# Patient Record
Sex: Female | Born: 1990
Health system: Southern US, Community
[De-identification: ages and names within clinical notes are randomized; demographics above are authoritative.]

## PROBLEM LIST (undated history)

## (undated) ENCOUNTER — Inpatient Hospital Stay (HOSPITAL_COMMUNITY): Payer: Self-pay

## (undated) ENCOUNTER — Emergency Department (HOSPITAL_BASED_OUTPATIENT_CLINIC_OR_DEPARTMENT_OTHER): Admission: EM | Payer: BLUE CROSS/BLUE SHIELD | Source: Home / Self Care

## (undated) DIAGNOSIS — F329 Major depressive disorder, single episode, unspecified: Secondary | ICD-10-CM

## (undated) DIAGNOSIS — T7491XA Unspecified adult maltreatment, confirmed, initial encounter: Secondary | ICD-10-CM

## (undated) DIAGNOSIS — F32A Depression, unspecified: Secondary | ICD-10-CM

## (undated) DIAGNOSIS — Z789 Other specified health status: Secondary | ICD-10-CM

## (undated) DIAGNOSIS — F509 Eating disorder, unspecified: Secondary | ICD-10-CM

## (undated) DIAGNOSIS — T7840XA Allergy, unspecified, initial encounter: Secondary | ICD-10-CM

## (undated) HISTORY — DX: Allergy, unspecified, initial encounter: T78.40XA

## (undated) HISTORY — DX: Depression, unspecified: F32.A

## (undated) HISTORY — DX: Eating disorder, unspecified: F50.9

## (undated) HISTORY — PX: INDUCED ABORTION: SHX677

---
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
## 2012-06-04 ENCOUNTER — Emergency Department (HOSPITAL_COMMUNITY): Payer: Self-pay

## 2012-06-04 ENCOUNTER — Emergency Department (HOSPITAL_COMMUNITY)
Admission: EM | Admit: 2012-06-04 | Discharge: 2012-06-04 | Disposition: A | Discharge status: Home / Self Care | Payer: Self-pay | Attending: Emergency Medicine | Admitting: Emergency Medicine

## 2012-06-04 ENCOUNTER — Encounter (HOSPITAL_COMMUNITY): Payer: Self-pay | Admitting: Emergency Medicine

## 2012-06-04 DIAGNOSIS — S62143A Displaced fracture of body of hamate [unciform] bone, unspecified wrist, initial encounter for closed fracture: Secondary | ICD-10-CM | POA: Insufficient documentation

## 2012-06-04 DIAGNOSIS — IMO0002 Reserved for concepts with insufficient information to code with codable children: Secondary | ICD-10-CM | POA: Insufficient documentation

## 2012-06-04 DIAGNOSIS — S60511A Abrasion of right hand, initial encounter: Secondary | ICD-10-CM

## 2012-06-04 DIAGNOSIS — F17200 Nicotine dependence, unspecified, uncomplicated: Secondary | ICD-10-CM | POA: Insufficient documentation

## 2012-06-04 DIAGNOSIS — Z23 Encounter for immunization: Secondary | ICD-10-CM | POA: Insufficient documentation

## 2012-06-04 DIAGNOSIS — S62141A Displaced fracture of body of hamate [unciform] bone, right wrist, initial encounter for closed fracture: Secondary | ICD-10-CM

## 2012-06-04 MED ORDER — IBUPROFEN 600 MG PO TABS: 600.0000 mg | ORAL_TABLET | Freq: Four times a day (QID) | ORAL | Status: DC | PRN

## 2012-06-04 MED ORDER — HYDROCODONE-ACETAMINOPHEN 5-325 MG PO TABS: 1.0000 | ORAL_TABLET | Freq: Four times a day (QID) | ORAL | Status: DC | PRN

## 2012-06-04 MED ORDER — OXYCODONE-ACETAMINOPHEN 5-325 MG PO TABS
1.0000 | ORAL_TABLET | Freq: Once | ORAL | Status: AC
  Administered 2012-06-04: 1 via ORAL
  Filled 2012-06-04: qty 1

## 2012-06-04 MED ORDER — TETANUS-DIPHTH-ACELL PERTUSSIS 5-2.5-18.5 LF-MCG/0.5 IM SUSP
0.5000 mL | Freq: Once | INTRAMUSCULAR | Status: AC
  Administered 2012-06-04: 0.5 mL via INTRAMUSCULAR
  Filled 2012-06-04: qty 0.5

## 2012-06-04 MED ORDER — AMOXICILLIN-POT CLAVULANATE 875-125 MG PO TABS: 1.0000 | ORAL_TABLET | Freq: Two times a day (BID) | ORAL | Status: DC
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
## 2012-06-06 NOTE — ED Provider Notes (Signed)
Medical screening examination/treatment/procedure(s) were performed by non-physician practitioner and as supervising physician I was immediately available for consultation/collaboration.   Aundrea Higginbotham M Leeman Johnsey, DO 06/06/12 1828 

## 2012-06-19 ENCOUNTER — Emergency Department (HOSPITAL_COMMUNITY): Payer: Self-pay

## 2012-06-19 ENCOUNTER — Encounter (HOSPITAL_COMMUNITY): Payer: Self-pay | Admitting: *Deleted

## 2012-06-19 ENCOUNTER — Emergency Department (HOSPITAL_COMMUNITY)
Admission: EM | Admit: 2012-06-19 | Discharge: 2012-06-19 | Disposition: A | Discharge status: Home / Self Care | Payer: Self-pay | Attending: Emergency Medicine | Admitting: Emergency Medicine

## 2012-06-19 DIAGNOSIS — S62141D Displaced fracture of body of hamate [unciform] bone, right wrist, subsequent encounter for fracture with routine healing: Secondary | ICD-10-CM

## 2012-06-19 DIAGNOSIS — Y939 Activity, unspecified: Secondary | ICD-10-CM | POA: Insufficient documentation

## 2012-06-19 DIAGNOSIS — S62143A Displaced fracture of body of hamate [unciform] bone, unspecified wrist, initial encounter for closed fracture: Secondary | ICD-10-CM | POA: Insufficient documentation

## 2012-06-19 DIAGNOSIS — Y929 Unspecified place or not applicable: Secondary | ICD-10-CM | POA: Insufficient documentation

## 2012-06-19 DIAGNOSIS — F17200 Nicotine dependence, unspecified, uncomplicated: Secondary | ICD-10-CM | POA: Insufficient documentation

## 2012-06-19 DIAGNOSIS — X58XXXA Exposure to other specified factors, initial encounter: Secondary | ICD-10-CM | POA: Insufficient documentation

## 2012-06-19 MED ORDER — TRAMADOL HCL 50 MG PO TABS: 50.0000 mg | ORAL_TABLET | Freq: Three times a day (TID) | ORAL | Status: DC | PRN

## 2012-06-19 MED ORDER — HYDROCODONE-ACETAMINOPHEN 5-325 MG PO TABS
2.0000 | ORAL_TABLET | Freq: Once | ORAL | Status: AC
  Administered 2012-06-19: 2 via ORAL
  Filled 2012-06-19: qty 2
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
## 2012-06-19 NOTE — ED Provider Notes (Signed)
I saw and evaluated the patient, reviewed the resident's note and I agree with the findings and plan.   .Face to face Exam:  General:  Awake HEENT:  Atraumatic Resp:  Normal effort Abd:  Nondistended Neuro:No focal weakness   Nelia Shi, MD 06/19/12 1820
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
## 2013-01-31 ENCOUNTER — Encounter (HOSPITAL_COMMUNITY): Payer: Self-pay | Admitting: Emergency Medicine

## 2013-01-31 ENCOUNTER — Emergency Department (HOSPITAL_COMMUNITY): Payer: Self-pay

## 2013-01-31 ENCOUNTER — Emergency Department (HOSPITAL_COMMUNITY)
Admission: EM | Admit: 2013-01-31 | Discharge: 2013-01-31 | Disposition: A | Discharge status: Home / Self Care | Payer: Self-pay | Attending: Emergency Medicine | Admitting: Emergency Medicine

## 2013-01-31 DIAGNOSIS — F17200 Nicotine dependence, unspecified, uncomplicated: Secondary | ICD-10-CM | POA: Insufficient documentation

## 2013-01-31 DIAGNOSIS — J40 Bronchitis, not specified as acute or chronic: Secondary | ICD-10-CM | POA: Insufficient documentation

## 2013-01-31 MED ORDER — PREDNISONE 20 MG PO TABS: 40.0000 mg | ORAL_TABLET | Freq: Every day | ORAL | Status: DC

## 2013-01-31 MED ORDER — ALBUTEROL SULFATE HFA 108 (90 BASE) MCG/ACT IN AERS
2.0000 | INHALATION_SPRAY | Freq: Once | RESPIRATORY_TRACT | Status: AC
  Administered 2013-01-31: 2 via RESPIRATORY_TRACT
  Filled 2013-01-31: qty 6.7

## 2013-01-31 MED ORDER — PSEUDOEPHEDRINE HCL 30 MG PO TABS: 30.0000 mg | ORAL_TABLET | ORAL | Status: DC | PRN

## 2013-01-31 MED ORDER — IBUPROFEN 800 MG PO TABS: 800.0000 mg | ORAL_TABLET | Freq: Three times a day (TID) | ORAL | Status: DC

## 2013-01-31 NOTE — ED Notes (Signed)
Pt reports1 week hx of sore throat, cough and chest wall pain, headaches. Tx with motrin, did not decrease pain

## 2013-01-31 NOTE — ED Provider Notes (Signed)
CSN: [id number]     Arrival date & time 01/31/13  1526 History   None    This chart was scribed for non-physician practitioner, Jaynie Crumble, PA-C, working with Roney Marion, MD by Arlan Organ, ED Scribe. This patient was seen in room WTR9/WTR9 and the patient's care was started at 4:31 PM.   No chief complaint on file.  The history is provided by the patient. No language interpreter was used.   HPI Comments: Linda Wallace is a 22 y.o. female who presents to the Emergency Department complaining of a sudden onset, gradually worsening, constant sore throat that started about 7 days ago. She also reports a HA, SOB, and a productive cough consisting of green sputum. She has not tried any OTC medications for her symptoms. 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  No past medical history on file. No past surgical history on file. Family History  Problem Relation Age of Onset  . Hypertension Other    History  Substance Use Topics  . Smoking status: Current Every Day Smoker    Types: Cigarettes  . Smokeless tobacco: Not on file  . Alcohol Use: Yes   OB History   Grav Para Term Preterm Abortions TAB SAB Ect Mult Living                 Review of Systems  HENT: Positive for sore throat.   Neurological: Positive for headaches.  All other systems reviewed and are negative.    Allergies  Review of patient's allergies indicates no known allergies.  Home Medications   Current Outpatient Rx  Name  Route  Sig  Dispense  Refill  . ibuprofen (ADVIL,MOTRIN) 600 MG tablet   Oral   Take 1 tablet (600 mg total) by mouth every 6 (six) hours as needed for pain.   30 tablet   0   . traMADol (ULTRAM) 50 MG tablet   Oral   Take 1 tablet (50 mg total) by mouth every 8 (eight) hours as needed for pain.   30 tablet   0    There were no vitals taken for this visit. Physical Exam  Nursing note and vitals  reviewed. Constitutional: She is oriented to person, place, and time. She appears well-developed and well-nourished.  HENT:  Head: Normocephalic and atraumatic.  Right Ear: Tympanic membrane normal.  Left Ear: Tympanic membrane normal.  Mouth/Throat: Uvula is midline and mucous membranes are normal. No trismus in the jaw. Posterior oropharyngeal erythema present. No oropharyngeal exudate or tonsillar abscesses.  Nasal congestion   Eyes: EOM are normal.  Neck: Normal range of motion.  Cardiovascular: Normal rate.   Pulmonary/Chest: Effort normal.  Musculoskeletal: Normal range of motion.  Neurological: She is alert and oriented to person, place, and time.  Skin: Skin is warm and dry.  Psychiatric: She has a normal mood and affect. Her behavior is normal.    ED Course  Procedures (including critical care time)   COORDINATION OF CARE: 4:36 PM- Will order chest X-Ray. Discussed treatment plan with pt at bedside and pt agreed to plan.     Labs Review Labs Reviewed - No data to display Imaging Review Dg Chest 2 View  01/31/2013   CLINICAL DATA:  Cough and shortness of breath for 10 days  EXAM: CHEST  2 VIEW  COMPARISON:  None.  FINDINGS: The heart size and mediastinal contours are within normal limits. Both lungs  are clear. The visualized skeletal structures are unremarkable.  IMPRESSION: No active cardiopulmonary disease.   Electronically Signed   By: Esperanza Heir M.D.   On: 01/31/2013 17:08    EKG Interpretation   None       MDM   1. Bronchitis     Patient with viral URI type symptoms. Admits to possible fevers at home although not did not measured her temperature, shortness of breath, cough, congestion. She did not take any over-the-counter medications prior to coming in. Her vital signs are normal. Her examination is unremarkable. Her lungs are clear. Chest x-ray obtained to rule out pneumonia and is normal. Will treat for viral bronchitis. Will start on a short course of  prednisone for 5 days, ibuprofen for her pain, Sudafed for congestion. Inhaler provided. She is encouraged to stop smoking.  Filed Vitals:   01/31/13 1638  BP: 108/56  Pulse: 84  Temp: 98.2 F (36.8 C)  TempSrc: Oral  Resp: 18  Weight: 116 lb (52.617 kg)  SpO2: 99%    I personally performed the services described in this documentation, which was scribed in my presence. The recorded information has been reviewed and is accurate.    Lottie Mussel, PA-C 01/31/13 1719

## 2013-02-04 NOTE — ED Provider Notes (Signed)
I personally performed the services described in this documentation, which was scribed in my presence. The recorded information has been reviewed and is accurate.   Ahmir Bracken J Callie Bunyard, MD 02/04/13 0719 

## 2013-03-15 ENCOUNTER — Encounter (HOSPITAL_COMMUNITY): Payer: Self-pay | Admitting: Emergency Medicine

## 2013-03-15 ENCOUNTER — Emergency Department (HOSPITAL_COMMUNITY)
Admission: EM | Admit: 2013-03-15 | Discharge: 2013-03-15 | Disposition: A | Discharge status: Home / Self Care | Payer: BC Managed Care – PPO | Attending: Emergency Medicine | Admitting: Emergency Medicine

## 2013-03-15 DIAGNOSIS — R1013 Epigastric pain: Secondary | ICD-10-CM | POA: Insufficient documentation

## 2013-03-15 DIAGNOSIS — Z3202 Encounter for pregnancy test, result negative: Secondary | ICD-10-CM | POA: Insufficient documentation

## 2013-03-15 DIAGNOSIS — R51 Headache: Secondary | ICD-10-CM | POA: Insufficient documentation

## 2013-03-15 DIAGNOSIS — IMO0002 Reserved for concepts with insufficient information to code with codable children: Secondary | ICD-10-CM | POA: Insufficient documentation

## 2013-03-15 DIAGNOSIS — F17200 Nicotine dependence, unspecified, uncomplicated: Secondary | ICD-10-CM | POA: Insufficient documentation

## 2013-03-15 DIAGNOSIS — R197 Diarrhea, unspecified: Secondary | ICD-10-CM | POA: Insufficient documentation

## 2013-03-15 DIAGNOSIS — R42 Dizziness and giddiness: Secondary | ICD-10-CM | POA: Insufficient documentation

## 2013-03-15 DIAGNOSIS — R112 Nausea with vomiting, unspecified: Secondary | ICD-10-CM | POA: Insufficient documentation

## 2013-03-15 LAB — URINALYSIS W MICROSCOPIC + REFLEX CULTURE
BILIRUBIN URINE: NEGATIVE
GLUCOSE, UA: NEGATIVE mg/dL
Hgb urine dipstick: NEGATIVE
Ketones, ur: NEGATIVE mg/dL
LEUKOCYTES UA: NEGATIVE
NITRITE: NEGATIVE
PH: 8 (ref 5.0–8.0)
Protein, ur: 100 mg/dL — AB
SPECIFIC GRAVITY, URINE: 1.029 (ref 1.005–1.030)
Urobilinogen, UA: 0.2 mg/dL (ref 0.0–1.0)

## 2013-03-15 LAB — COMPREHENSIVE METABOLIC PANEL
ALBUMIN: 4.7 g/dL (ref 3.5–5.2)
ALT: 28 U/L (ref 0–35)
AST: 24 U/L (ref 0–37)
Alkaline Phosphatase: 65 U/L (ref 39–117)
BUN: 7 mg/dL (ref 6–23)
CALCIUM: 9.8 mg/dL (ref 8.4–10.5)
CO2: 28 mEq/L (ref 19–32)
Chloride: 105 mEq/L (ref 96–112)
Creatinine, Ser: 0.87 mg/dL (ref 0.50–1.10)
GFR calc Af Amer: >90 mL/min (ref >90)
GFR calc non Af Amer: >90 mL/min (ref >90)
Glucose, Bld: 95 mg/dL (ref 70–99)
Potassium: 4.5 mEq/L (ref 3.7–5.3)
SODIUM: 148 meq/L — AB (ref 137–147)
TOTAL PROTEIN: 8.4 g/dL — AB (ref 6.0–8.3)
Total Bilirubin: 0.2 mg/dL — ABNORMAL LOW (ref 0.3–1.2)

## 2013-03-15 LAB — CBC WITH DIFFERENTIAL/PLATELET
BASOS ABS: 0 10*3/uL (ref 0.0–0.1)
Basophils Relative: 0 % (ref 0–1)
EOS ABS: 0 10*3/uL (ref 0.0–0.7)
EOS PCT: 0 % (ref 0–5)
HCT: 44.8 % (ref 36.0–46.0)
Hemoglobin: 15.3 g/dL — ABNORMAL HIGH (ref 12.0–15.0)
Lymphocytes Relative: 11 % — ABNORMAL LOW (ref 12–46)
Lymphs Abs: 1.7 10*3/uL (ref 0.7–4.0)
MCH: 30.9 pg (ref 26.0–34.0)
MCHC: 34.2 g/dL (ref 30.0–36.0)
MCV: 90.5 fL (ref 78.0–100.0)
Monocytes Absolute: 0.6 10*3/uL (ref 0.1–1.0)
Monocytes Relative: 4 % (ref 3–12)
NEUTROS PCT: 85 % — AB (ref 43–77)
Neutro Abs: 13.8 10*3/uL — ABNORMAL HIGH (ref 1.7–7.7)
PLATELETS: 367 10*3/uL (ref 150–400)
RBC: 4.95 MIL/uL (ref 3.87–5.11)
RDW: 12.7 % (ref 11.5–15.5)
WBC: 16.2 10*3/uL — AB (ref 4.0–10.5)

## 2013-03-15 LAB — POCT PREGNANCY, URINE: Preg Test, Ur: NEGATIVE

## 2013-03-15 LAB — LIPASE, BLOOD: Lipase: 22 U/L (ref 11–59)

## 2013-03-15 MED ORDER — ONDANSETRON 8 MG PO TBDP
8.0000 mg | ORAL_TABLET | Freq: Once | ORAL | Status: AC
  Administered 2013-03-15: 8 mg via ORAL
  Filled 2013-03-15: qty 1

## 2013-03-15 MED ORDER — ONDANSETRON HCL 4 MG PO TABS: 4.0000 mg | ORAL_TABLET | Freq: Four times a day (QID) | ORAL | Status: DC
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
## 2013-03-15 NOTE — Discharge Instructions (Signed)
Drink plenty of fluids. Take zofran as needed for nausea. You can take 1-2 tablets every 6 hrs. Follow up with your doctor. Avoid alcohol.    How Much is Too Much Alcohol? Drinking too much alcohol can cause injury, accidents, and health problems. These types of problems can include:   Car crashes.  Falls.  Family fighting (domestic violence).  Drowning.  Fights.  Injuries.  Burns.  Damage to certain organs.  Having a baby with birth defects. ONE DRINK CAN BE TOO MUCH WHEN YOU ARE:  Working.  Pregnant or breastfeeding.  Taking medicines. Ask your doctor.  Driving or planning to drive. WHAT IS A STANDARD DRINK?   1 regular beer (12 ounces or 360 milliliters).  1 glass of wine (5 ounces or 150 milliliters).  1 shot of liquor (1.5 ounces or 45 milliliters). BLOOD ALCOHOL LEVELS   .00 A person is sober.  Marland Kitchen.03 A person has no trouble keeping balance, talking, or seeing right, but a "buzz" may be felt.  Marland Kitchen.05 A person feels "buzzed" and relaxed.  Marland Kitchen.08 or .10  A person is drunk. He or she has trouble talking, seeing right, and keeping his or her balance.  .15 A person loses body control and may pass out (blackout).  .20 A person has trouble walking (staggering) and throws up (vomits).  .30 A person will pass out (unconscious).  .40+ A person will be in a coma. Death is possible. If you or someone you know has a drinking problem, get help from a doctor.  Document Released: 12/23/2008 Document Revised: 05/21/2011 Document Reviewed: 12/23/2008 [facility] LLCExitCare Patient Information 2014 [place], [place].
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
## 2013-03-25 NOTE — ED Provider Notes (Signed)
Medical screening examination/treatment/procedure(s) were performed by non-physician practitioner and as supervising physician I was immediately available for consultation/collaboration.  EKG Interpretation   None         Valita Righter, MD 03/25/13 0021 

## 2013-07-01 ENCOUNTER — Emergency Department (HOSPITAL_BASED_OUTPATIENT_CLINIC_OR_DEPARTMENT_OTHER)
Admission: EM | Admit: 2013-07-01 | Discharge: 2013-07-01 | Disposition: A | Discharge status: Home / Self Care | Payer: BC Managed Care – PPO | Attending: Emergency Medicine | Admitting: Emergency Medicine

## 2013-07-01 ENCOUNTER — Encounter (HOSPITAL_BASED_OUTPATIENT_CLINIC_OR_DEPARTMENT_OTHER): Payer: Self-pay | Admitting: Emergency Medicine

## 2013-07-01 DIAGNOSIS — F17200 Nicotine dependence, unspecified, uncomplicated: Secondary | ICD-10-CM | POA: Insufficient documentation

## 2013-07-01 DIAGNOSIS — B360 Pityriasis versicolor: Secondary | ICD-10-CM

## 2013-07-01 MED ORDER — SELENIUM SULFIDE 2.5 % EX LOTN: TOPICAL_LOTION | CUTANEOUS | Status: DC

## 2013-07-01 NOTE — Discharge Instructions (Signed)
Tinea Versicolor  Tinea versicolor is a common yeast infection of the skin. This condition becomes known when the yeast on our skin starts to overgrow (yeast is a normal inhabitant on our skin). This condition is noticed as white or light brown patches on brown skin, and is more evident in the summer on tanned skin. These areas are slightly scaly if scratched. The light patches from the yeast become evident when the yeast creates "holes in your suntan". This is most often noticed in the summer. The patches are usually located on the chest, back, pubis, neck and body folds. However, it may occur on any area of body. Mild itching and inflammation (redness or soreness) may be present.  DIAGNOSIS   The diagnosisof this is made clinically (by looking). Cultures from samples are usually not needed. Examination under the microscope may help. However, yeast is normally found on skin. The diagnosis still remains clinical. Examination under Wood's Ultraviolet Light can determine the extent of the infection.  TREATMENT   This common infection is usually only of cosmetic (only a concern to your appearance). It is easily treated with dandruff shampoo used during showers or bathing. Vigorous scrubbing will eliminate the yeast over several days time. The light areas in your skin may remain for weeks or months after the infection is cured unless your skin is exposed to sunlight. The lighter or darker spots caused by the fungus that remain after complete treatment are not a sign of treatment failure; it will take a long time to resolve. Your caregiver may recommend a number of commercial preparations or medication by mouth if home care is not working. Recurrence is common and preventative medication may be necessary.  This skin condition is not highly contagious. Special care is not needed to protect close friends and family members. Normal hygiene is usually enough. Follow up is required only if you develop complications (such as a  secondary infection from scratching), if recommended by your caregiver, or if no relief is obtained from the preparations used.  Document Released: 02/24/2000 Document Revised: 05/21/2011 Document Reviewed: 04/07/2008  Exit[employer] Patient Information 2014 [employer].

## 2013-07-01 NOTE — ED Provider Notes (Signed)
CSN: [id number]     Arrival date & time 07/01/13  1316 History   First MD Initiated Contact with Patient 07/01/13 1324     Chief Complaint  Patient presents with  . Rash     (Consider location/radiation/quality/duration/timing/severity/associated sxs/prior Treatment) HPI Comments: Patient presents to the ER for evaluation of rash. She first noticed a rash on her arms 2 weeks ago. Since then the areas continue to spread. She is noticing spots now on her arms and legs. She reports that they occasionally itch, but do not hurt. No drainage. No new skin products. No contacts with similar rash.  Patient is a 23 y.o. female presenting with rash.  Rash   History reviewed. No pertinent past medical history. History reviewed. No pertinent past surgical history. Family History  Problem Relation Age of Onset  . Hypertension Other   . Diabetes Other    History  Substance Use Topics  . Smoking status: Current Every Day Smoker -- 0.50 packs/day    Types: Cigarettes  . Smokeless tobacco: Not on file  . Alcohol Use: Yes   OB History   Grav Para Term Preterm Abortions TAB SAB Ect Mult Living                 Review of Systems  Skin: Positive for rash.  All other systems reviewed and are negative.     Allergies  Review of patient's allergies indicates no known allergies.  Home Medications   Prior to Admission medications   Not on File   BP 113/56  Pulse 84  Temp(Src) 98.4 F (36.9 C) (Oral)  Resp 16  Ht 5\' 3"  (1.6 m)  Wt 117 lb (53.071 kg)  BMI 20.73 kg/m2  SpO2 99%  LMP 06/08/2013 Physical Exam  Constitutional: She is oriented to person, place, and time. She appears well-developed and well-nourished. No distress.  HENT:  Head: Normocephalic and atraumatic.  Right Ear: Hearing normal.  Left Ear: Hearing normal.  Nose: Nose normal.  Mouth/Throat: Oropharynx is clear and moist and mucous membranes are normal.  Eyes: Conjunctivae and EOM are normal. Pupils are equal,  round, and reactive to light.  Neck: Normal range of motion. Neck supple.  Cardiovascular: Regular rhythm, S1 normal and S2 normal.  Exam reveals no gallop and no friction rub.   No murmur heard. Pulmonary/Chest: Effort normal and breath sounds normal. No respiratory distress. She exhibits no tenderness.  Abdominal: Soft. Normal appearance and bowel sounds are normal. There is no hepatosplenomegaly. There is no tenderness. There is no rebound, no guarding, no tenderness at McBurney's point and negative Murphy's sign. No hernia.  Musculoskeletal: Normal range of motion.  Neurological: She is alert and oriented to person, place, and time. She has normal strength. No cranial nerve deficit or sensory deficit. Coordination normal. GCS eye subscore is 4. GCS verbal subscore is 5. GCS motor subscore is 6.  Skin: Skin is warm, dry and intact. Rash (circular, slightly raised patches and plaques varying from several millimeters to 1 cm in diameter - some are hyperpigmented, some are hypopigmented - nontender, nonerythematous) noted. No cyanosis.  Psychiatric: She has a normal mood and affect. Her speech is normal and behavior is normal. Thought content normal.    ED Course  Procedures (including critical care time) Labs Review Labs Reviewed - No data to display  Imaging Review No results found.   EKG Interpretation None      MDM   Final diagnoses:  Tinea versicolor   Patient presents  with a diffuse rash there are circular patches on extremities. Lesions are apparently of various age and some are hyperpigmented, some hypopigmented. This is consistent with tinea versicolor, treated with selenium. No evidence of bacterial infection.   Gilda Creasehristopher J. Pollina, MD 07/02/13 [phone]

## 2013-07-01 NOTE — ED Notes (Signed)
Pt c/o rash to entire body x 2 weeks

## 2013-08-26 ENCOUNTER — Emergency Department (HOSPITAL_COMMUNITY): Payer: BC Managed Care – PPO

## 2013-08-26 ENCOUNTER — Emergency Department (HOSPITAL_COMMUNITY)
Admission: EM | Admit: 2013-08-26 | Discharge: 2013-08-26 | Disposition: A | Discharge status: Home / Self Care | Payer: BC Managed Care – PPO | Attending: Emergency Medicine | Admitting: Emergency Medicine

## 2013-08-26 ENCOUNTER — Encounter (HOSPITAL_COMMUNITY): Payer: Self-pay | Admitting: Emergency Medicine

## 2013-08-26 DIAGNOSIS — Y929 Unspecified place or not applicable: Secondary | ICD-10-CM | POA: Insufficient documentation

## 2013-08-26 DIAGNOSIS — R209 Unspecified disturbances of skin sensation: Secondary | ICD-10-CM | POA: Insufficient documentation

## 2013-08-26 DIAGNOSIS — S61209A Unspecified open wound of unspecified finger without damage to nail, initial encounter: Secondary | ICD-10-CM | POA: Insufficient documentation

## 2013-08-26 DIAGNOSIS — W230XXA Caught, crushed, jammed, or pinched between moving objects, initial encounter: Secondary | ICD-10-CM | POA: Insufficient documentation

## 2013-08-26 DIAGNOSIS — S61210A Laceration without foreign body of right index finger without damage to nail, initial encounter: Secondary | ICD-10-CM

## 2013-08-26 DIAGNOSIS — Y9389 Activity, other specified: Secondary | ICD-10-CM | POA: Insufficient documentation

## 2013-08-26 DIAGNOSIS — Z87891 Personal history of nicotine dependence: Secondary | ICD-10-CM | POA: Insufficient documentation

## 2013-08-26 DIAGNOSIS — F1010 Alcohol abuse, uncomplicated: Secondary | ICD-10-CM | POA: Insufficient documentation

## 2013-08-26 NOTE — Discharge Instructions (Signed)

## 2013-08-26 NOTE — ED Notes (Signed)
Patient states she smashed her hand in a sliding glass door this am. Denies any home medications PTA.

## 2013-08-26 NOTE — ED Notes (Signed)
Patient states she is unable to move her thumb, index, or middle fingers. Blood noted to her hand.

## 2013-08-26 NOTE — ED Provider Notes (Signed)
CSN: [id number]     Arrival date & time 08/26/13  [id number] History   First MD Initiated Contact with Patient 08/26/13 [phone]     Chief Complaint  Patient presents with  . Hand Pain    right     (Consider location/radiation/quality/duration/timing/severity/associated sxs/prior Treatment) Patient is a 23 y.o. female presenting with hand pain. The history is provided by the patient.  Hand Pain This is a new problem. The current episode started 3 to 5 hours ago. The problem has not changed since onset.Pertinent negatives include no chest pain, no abdominal pain and no shortness of breath. Nothing aggravates the symptoms. Nothing relieves the symptoms.    History reviewed. No pertinent past medical history. History reviewed. No pertinent past surgical history. Family History  Problem Relation Age of Onset  . Hypertension Other   . Diabetes Other    History  Substance Use Topics  . Smoking status: Former Smoker    Quit date: 08/18/2013  . Smokeless tobacco: Not on file  . Alcohol Use: Yes     Comment: occ   OB History   Grav Para Term Preterm Abortions TAB SAB Ect Mult Living                 Review of Systems  Constitutional: Negative for fever and chills.  Respiratory: Negative for cough and shortness of breath.   Cardiovascular: Negative for chest pain.  Gastrointestinal: Negative for vomiting and abdominal pain.  All other systems reviewed and are negative.     Allergies  Food  Home Medications   Prior to Admission medications   Medication Sig Start Date End Date Taking? Authorizing Provider  albuterol (PROVENTIL HFA;VENTOLIN HFA) 108 (90 BASE) MCG/ACT inhaler Inhale into the lungs every 6 (six) hours as needed for wheezing or shortness of breath.   Yes Historical Provider, MD  Multiple Vitamins-Minerals (HAIR/SKIN/NAILS PO) Take 1 tablet by mouth daily.   Yes Historical Provider, MD   BP 111/63  Pulse 76  Temp(Src) 98 F (36.7 C) (Oral)  Resp 16  SpO2 97%  LMP  08/12/2013 Physical Exam  Nursing note and vitals reviewed. Constitutional: She is oriented to person, place, and time. She appears well-developed and well-nourished. No distress.  HENT:  Head: Normocephalic and atraumatic.  Mouth/Throat: Oropharynx is clear and moist.  Eyes: EOM are normal. Pupils are equal, round, and reactive to light.  Neck: Normal range of motion. Neck supple.  Cardiovascular: Normal rate and regular rhythm.  Exam reveals no friction rub.   No murmur heard. Pulmonary/Chest: Effort normal and breath sounds normal. No respiratory distress. She has no wheezes. She has no rales.  Abdominal: Soft. She exhibits no distension. There is no tenderness. There is no rebound.  Musculoskeletal: Normal range of motion. She exhibits no edema.       Hands: Neurological: She is alert and oriented to person, place, and time. No cranial nerve deficit. She exhibits normal muscle tone. Coordination normal.  Skin: No rash noted. She is not diaphoretic.    ED Course  LACERATION REPAIR Date/Time: 08/26/2013 8:34 AM Performed by: Dagmar HaitWALDEN, WILLIAM BLAIR Authorized by: Dagmar HaitWALDEN, WILLIAM BLAIR Consent: Verbal consent obtained. Body area: upper extremity Location details: right index finger Laceration length: 1.5 cm Foreign bodies: no foreign bodies Tendon involvement: none Nerve involvement: none Vascular damage: no Patient sedated: no Preparation: Patient was prepped and draped in the usual sterile fashion. Irrigation solution: saline Irrigation method: syringe Amount of cleaning: standard Debridement: none Degree of undermining: none Skin  closure: 6-0 nylon Number of sutures: 2 Technique: simple Approximation: close Approximation difficulty: simple Patient tolerance: Patient tolerated the procedure well with no immediate complications.   (including critical care time) Labs Review Labs Reviewed - No data to display  Imaging Review Dg Hand Complete Right  08/26/2013    CLINICAL DATA:  Put right hand through sliding door, with laceration at the right third and fourth fingers, and associated pain.  EXAM: RIGHT HAND - COMPLETE 3+ VIEW  COMPARISON:  Right hand radiographs performed 06/19/2012  FINDINGS: There is no evidence of fracture or dislocation. The joint spaces are preserved; the known soft tissue lacerations are not well characterized on radiograph. No radiopaque foreign bodies are seen. The carpal rows are intact, and demonstrate normal alignment. Mild negative ulnar variance is noted.  IMPRESSION: No evidence of fracture or dislocation. No radiopaque foreign bodies seen.   Electronically Signed   By: Roanna RaiderJeffery  Chang M.D.   On: 08/26/2013 06:29     EKG Interpretation None      MDM   Final diagnoses:  Laceration of right index finger w/o foreign body w/o damage to nail    23 year old female here with hand injury. Signed hand in a glass door. No glass broken, but her hand got smashed in a door. She is heavily intoxicated. She has a small, 1.5 cm laceration to the pad of the right index finger just distal to the DIP joint. She has good motion of the finger. She states she cannot feel her entire hand, particularly touching can feel pain when I was clear a laceration. She has full range of motion. Neck her numbness is secondary to the injury and her intoxication. Doubt nerve injury. Will repair laceration, re-evaluate. On re-eval prior to lac repair, feeling returned to finger. ROM normal. Cap refill normal. Lac repair as above, stable for discharge.  Dagmar HaitWilliam Blair Walden, MD 08/26/13 [phone]

## 2014-01-26 ENCOUNTER — Emergency Department (HOSPITAL_COMMUNITY)
Admission: EM | Admit: 2014-01-26 | Discharge: 2014-01-26 | Disposition: A | Discharge status: Home / Self Care | Payer: BC Managed Care – PPO | Attending: Emergency Medicine | Admitting: Emergency Medicine

## 2014-01-26 ENCOUNTER — Encounter (HOSPITAL_COMMUNITY): Payer: Self-pay

## 2014-01-26 DIAGNOSIS — Z87891 Personal history of nicotine dependence: Secondary | ICD-10-CM | POA: Insufficient documentation

## 2014-01-26 DIAGNOSIS — Z79899 Other long term (current) drug therapy: Secondary | ICD-10-CM | POA: Diagnosis not present

## 2014-01-26 DIAGNOSIS — R1013 Epigastric pain: Secondary | ICD-10-CM | POA: Insufficient documentation

## 2014-01-26 DIAGNOSIS — Z3202 Encounter for pregnancy test, result negative: Secondary | ICD-10-CM | POA: Diagnosis not present

## 2014-01-26 LAB — COMPREHENSIVE METABOLIC PANEL
ALK PHOS: 53 U/L (ref 39–117)
ALT: 8 U/L (ref 0–35)
ANION GAP: 12 (ref 5–15)
AST: 16 U/L (ref 0–37)
Albumin: 4.6 g/dL (ref 3.5–5.2)
BILIRUBIN TOTAL: 0.4 mg/dL (ref 0.3–1.2)
BUN: 11 mg/dL (ref 6–23)
CHLORIDE: 103 meq/L (ref 96–112)
CO2: 28 mEq/L (ref 19–32)
Calcium: 9.9 mg/dL (ref 8.4–10.5)
Creatinine, Ser: 0.83 mg/dL (ref 0.50–1.10)
GFR calc Af Amer: >90 mL/min (ref >90)
GFR calc non Af Amer: >90 mL/min (ref >90)
Glucose, Bld: 95 mg/dL (ref 70–99)
POTASSIUM: 4.1 meq/L (ref 3.7–5.3)
SODIUM: 143 meq/L (ref 137–147)
TOTAL PROTEIN: 7.9 g/dL (ref 6.0–8.3)

## 2014-01-26 LAB — URINALYSIS, ROUTINE W REFLEX MICROSCOPIC
Glucose, UA: 250 mg/dL — AB
HGB URINE DIPSTICK: NEGATIVE
KETONES UR: NEGATIVE mg/dL
Leukocytes, UA: NEGATIVE
NITRITE: NEGATIVE
PH: 5.5 (ref 5.0–8.0)
Protein, ur: NEGATIVE mg/dL
SPECIFIC GRAVITY, URINE: 1.036 — AB (ref 1.005–1.030)
Urobilinogen, UA: 1 mg/dL (ref 0.0–1.0)

## 2014-01-26 LAB — URINE MICROSCOPIC-ADD ON

## 2014-01-26 LAB — CBC WITH DIFFERENTIAL/PLATELET
BASOS PCT: 0 % (ref 0–1)
Basophils Absolute: 0 10*3/uL (ref 0.0–0.1)
Eosinophils Absolute: 0.1 10*3/uL (ref 0.0–0.7)
Eosinophils Relative: 1 % (ref 0–5)
HCT: 43 % (ref 36.0–46.0)
Hemoglobin: 14.7 g/dL (ref 12.0–15.0)
Lymphocytes Relative: 33 % (ref 12–46)
Lymphs Abs: 2.9 10*3/uL (ref 0.7–4.0)
MCH: 30.9 pg (ref 26.0–34.0)
MCHC: 34.2 g/dL (ref 30.0–36.0)
MCV: 90.5 fL (ref 78.0–100.0)
MONOS PCT: 6 % (ref 3–12)
Monocytes Absolute: 0.6 10*3/uL (ref 0.1–1.0)
NEUTROS ABS: 5.3 10*3/uL (ref 1.7–7.7)
NEUTROS PCT: 60 % (ref 43–77)
PLATELETS: 240 10*3/uL (ref 150–400)
RBC: 4.75 MIL/uL (ref 3.87–5.11)
RDW: 12.4 % (ref 11.5–15.5)
WBC: 8.9 10*3/uL (ref 4.0–10.5)

## 2014-01-26 LAB — POC URINE PREG, ED: Preg Test, Ur: NEGATIVE

## 2014-01-26 MED ORDER — PANTOPRAZOLE SODIUM 20 MG PO TBEC: 20.0000 mg | DELAYED_RELEASE_TABLET | Freq: Every day | ORAL | Status: DC

## 2014-01-26 NOTE — ED Notes (Signed)
Pt states she has had abdominal pain with headache x 3 months.  Pt states it can be around her period but not all the time.  Pt is sexually active but uses condoms.  Pt states her menstrual was 3 weeks late.  Then pt had slight bleeding.  Pt is bleeding today and cycle is normal.  Pt does not have have a gynecologist.

## 2014-01-26 NOTE — Discharge Instructions (Signed)
Abdominal Pain, Women °Abdominal (stomach, pelvic, or belly) pain can be caused by many things. It is important to tell your doctor: °· The location of the pain. °· Does it come and go or is it present all the time? °· Are there things that start the pain (eating certain foods, exercise)? °· Are there other symptoms associated with the pain (fever, nausea, vomiting, diarrhea)? °All of this is helpful to know when trying to find the cause of the pain. °CAUSES  °· Stomach: virus or bacteria infection, or ulcer. °· Intestine: appendicitis (inflamed appendix), regional ileitis (Crohn's disease), ulcerative colitis (inflamed colon), irritable bowel syndrome, diverticulitis (inflamed diverticulum of the colon), or cancer of the stomach or intestine. °· Gallbladder disease or stones in the gallbladder. °· Kidney disease, kidney stones, or infection. °· Pancreas infection or cancer. °· Fibromyalgia (pain disorder). °· Diseases of the female organs: °¨ Uterus: fibroid (non-cancerous) tumors or infection. °¨ Fallopian tubes: infection or tubal pregnancy. °¨ Ovary: cysts or tumors. °¨ Pelvic adhesions (scar tissue). °¨ Endometriosis (uterus lining tissue growing in the pelvis and on the pelvic organs). °¨ Pelvic congestion syndrome (female organs filling up with blood just before the menstrual period). °¨ Pain with the menstrual period. °¨ Pain with ovulation (producing an egg). °¨ Pain with an IUD (intrauterine device, birth control) in the uterus. °¨ Cancer of the female organs. °· Functional pain (pain not caused by a disease, may improve without treatment). °· Psychological pain. °· Depression. °DIAGNOSIS  °Your doctor will decide the seriousness of your pain by doing an examination. °· Blood tests. °· X-rays. °· Ultrasound. °· CT scan (computed tomography, special type of X-ray). °· MRI (magnetic resonance imaging). °· Cultures, for infection. °· Barium enema (dye inserted in the large intestine, to better view it with  X-rays). °· Colonoscopy (looking in intestine with a lighted tube). °· Laparoscopy (minor surgery, looking in abdomen with a lighted tube). °· Major abdominal exploratory surgery (looking in abdomen with a large incision). °TREATMENT  °The treatment will depend on the cause of the pain.  °· Many cases can be observed and treated at home. °· Over-the-counter medicines recommended by your caregiver. °· Prescription medicine. °· Antibiotics, for infection. °· Birth control pills, for painful periods or for ovulation pain. °· Hormone treatment, for endometriosis. °· Nerve blocking injections. °· Physical therapy. °· Antidepressants. °· Counseling with a psychologist or psychiatrist. °· Minor or major surgery. °HOME CARE INSTRUCTIONS  °· Do not take laxatives, unless directed by your caregiver. °· Take over-the-counter pain medicine only if ordered by your caregiver. Do not take aspirin because it can cause an upset stomach or bleeding. °· Try a clear liquid diet (broth or water) as ordered by your caregiver. Slowly move to a bland diet, as tolerated, if the pain is related to the stomach or intestine. °· Have a thermometer and take your temperature several times a day, and record it. °· Bed rest and sleep, if it helps the pain. °· Avoid sexual intercourse, if it causes pain. °· Avoid stressful situations. °· Keep your follow-up appointments and tests, as your caregiver orders. °· If the pain does not go away with medicine or surgery, you may try: °¨ Acupuncture. °¨ Relaxation exercises (yoga, meditation). °¨ Group therapy. °¨ Counseling. °SEEK MEDICAL CARE IF:  °· You notice certain foods cause stomach pain. °· Your home care treatment is not helping your pain. °· You need stronger pain medicine. °· You want your IUD removed. °· You feel faint or   lightheaded. °· You develop nausea and vomiting. °· You develop a rash. °· You are having side effects or an allergy to your medicine. °SEEK IMMEDIATE MEDICAL CARE IF:  °· Your  pain does not go away or gets worse. °· You have a fever. °· Your pain is felt only in portions of the abdomen. The right side could possibly be appendicitis. The left lower portion of the abdomen could be colitis or diverticulitis. °· You are passing blood in your stools (bright red or black tarry stools, with or without vomiting). °· You have blood in your urine. °· You develop chills, with or without a fever. °· You pass out. °MAKE SURE YOU:  °· Understand these instructions. °· Will watch your condition. °· Will get help right away if you are not doing well or get worse. °Document Released: 12/24/2006 Document Revised: 07/13/2013 Document Reviewed: 01/13/2009 °[employer]® Patient Information ©2015 [employer]. This information is not intended to replace advice given to you by your health care provider. Make sure you discuss any questions you have with your health care provider. ° °

## 2014-01-26 NOTE — ED Notes (Signed)
Patient with c/o abdominal pain, which she states is usual for her during menses--patient currently menstruating No N/V/D, per patient Patient in NAD

## 2014-01-26 NOTE — ED Provider Notes (Signed)
CSN: [id number]     Arrival date & time 01/26/14  1716 History   First MD Initiated Contact with Patient 01/26/14 1931     Chief Complaint  Patient presents with  . Abdominal Pain     (Consider location/radiation/quality/duration/timing/severity/associated sxs/prior Treatment) Patient is a 23 y.o. female presenting with abdominal pain. The history is provided by the patient. No language interpreter was used.  Abdominal Pain Pain location:  Epigastric Pain quality: aching   Pain radiates to:  Does not radiate Pain severity:  Moderate Onset quality:  Sudden Duration:  2 weeks Timing:  Intermittent (every other day) Progression:  Waxing and waning Chronicity:  New Relieved by:  Nothing Worsened by:  Nothing tried Ineffective treatments:  None tried Associated symptoms: no chest pain, no chills, no cough, no diarrhea, no dysuria, no fatigue, no fever, no nausea, no shortness of breath, no sore throat and no vomiting   Associated symptoms comment:  Headache  Risk factors: no alcohol abuse, no aspirin use, not elderly, has not had multiple surgeries, no NSAID use, not obese, not pregnant and no recent hospitalization     History reviewed. No pertinent past medical history. History reviewed. No pertinent past surgical history. Family History  Problem Relation Age of Onset  . Hypertension Other   . Diabetes Other    History  Substance Use Topics  . Smoking status: Former Smoker    Quit date: 08/18/2013  . Smokeless tobacco: Not on file  . Alcohol Use: Yes     Comment: occ   OB History    No data available     Review of Systems  Constitutional: Negative for fever, chills, diaphoresis, activity change, appetite change and fatigue.  HENT: Negative for congestion, facial swelling, rhinorrhea and sore throat.   Eyes: Negative for photophobia and discharge.  Respiratory: Negative for cough, chest tightness and shortness of breath.   Cardiovascular: Negative for chest pain,  palpitations and leg swelling.  Gastrointestinal: Positive for abdominal pain. Negative for nausea, vomiting and diarrhea.  Endocrine: Negative for polydipsia and polyuria.  Genitourinary: Negative for dysuria, frequency, difficulty urinating and pelvic pain.  Musculoskeletal: Negative for back pain, arthralgias, neck pain and neck stiffness.  Skin: Negative for color change and wound.  Allergic/Immunologic: Negative for immunocompromised state.  Neurological: Negative for facial asymmetry, weakness, numbness and headaches.  Hematological: Does not bruise/bleed easily.  Psychiatric/Behavioral: Negative for confusion and agitation.      Allergies  Food  Home Medications   Prior to Admission medications   Medication Sig Start Date End Date Taking? Authorizing Provider  albuterol (PROVENTIL HFA;VENTOLIN HFA) 108 (90 BASE) MCG/ACT inhaler Inhale into the lungs every 6 (six) hours as needed for wheezing or shortness of breath.   Yes Historical Provider, MD  Multiple Vitamins-Minerals (HAIR/SKIN/NAILS PO) Take 1 tablet by mouth daily.   Yes Historical Provider, MD  pantoprazole (PROTONIX) 20 MG tablet Take 1 tablet (20 mg total) by mouth daily. 01/26/14   Toy CookeyMegan Docherty, MD   BP 107/52 mmHg  Pulse 84  Temp(Src) 98.2 F (36.8 C) (Oral)  Resp 18  SpO2 99%  LMP 01/22/2014 Physical Exam  Constitutional: She is oriented to person, place, and time. She appears well-developed and well-nourished. No distress.  HENT:  Head: Normocephalic and atraumatic.  Mouth/Throat: No oropharyngeal exudate.  Eyes: Pupils are equal, round, and reactive to light.  Neck: Normal range of motion. Neck supple.  Cardiovascular: Normal rate, regular rhythm and normal heart sounds.  Exam reveals no gallop  and no friction rub.   No murmur heard. Pulmonary/Chest: Effort normal and breath sounds normal. No respiratory distress. She has no wheezes. She has no rales.  Abdominal: Soft. Bowel sounds are normal. She  exhibits no distension and no mass. There is tenderness in the epigastric area. There is no rigidity, no rebound, no guarding, no tenderness at McBurney's point and negative Murphy's sign.  Musculoskeletal: Normal range of motion. She exhibits no edema or tenderness.  Neurological: She is alert and oriented to person, place, and time.  Skin: Skin is warm and dry.  Psychiatric: She has a normal mood and affect.    ED Course  Procedures (including critical care time) Labs Review Labs Reviewed  URINALYSIS, ROUTINE W REFLEX MICROSCOPIC - Abnormal; Notable for the following:    Color, Urine AMBER (*)    APPearance TURBID (*)    Specific Gravity, Urine 1.036 (*)    Glucose, UA 250 (*)    Bilirubin Urine SMALL (*)    All other components within normal limits  URINE MICROSCOPIC-ADD ON - Abnormal; Notable for the following:    Squamous Epithelial / LPF FEW (*)    Bacteria, UA MANY (*)    Casts GRANULAR CAST (*)    Crystals CA OXALATE CRYSTALS (*)    All other components within normal limits  CBC WITH DIFFERENTIAL  COMPREHENSIVE METABOLIC PANEL  POC URINE PREG, ED    Imaging Review No results found.   EKG Interpretation None      MDM   Final diagnoses:  Epigastric pain    Pt is a 23 y.o. female with Pmhx as above who presents with about 3 months of intermittent epigastric pain. She states pain feels like a knot in her epigastrium. His no exacerbating or alleviating factors. No radiation. She will typically get headache at the same time but no other associated symptoms including no nausea, vomiting, fever, chills, diarrhea or constipation. She's also had no dysuria, vaginal bleeding or discharge. She is on her menstrual cycle now. On physical exam vital signs are stable and patient is in no acute distress. She has mild epigastric tenderness to palpation without rebound or guarding. Murphy sign is negative. Negative McBurney's point. Bowel sounds are normal.  CBC and see MP are normal.  Urine is not infected. I doubt acute surgical cause of pain such as cholecystitis, appendicitis, small bowel obstruction. I believe it is reasonable to start her on a trial of Protonix a symptoms may be gastritis. Return precautions given for new or worsening symptoms including worsening pain, fever, inability to tolerate liquids        Toy CookeyMegan Docherty, MD 01/26/14 2018

## 2014-03-12 HISTORY — PX: INDUCED ABORTION: SHX677

## 2014-06-29 ENCOUNTER — Encounter (HOSPITAL_BASED_OUTPATIENT_CLINIC_OR_DEPARTMENT_OTHER): Payer: Self-pay

## 2014-06-29 ENCOUNTER — Emergency Department (HOSPITAL_BASED_OUTPATIENT_CLINIC_OR_DEPARTMENT_OTHER): Payer: 59

## 2014-06-29 ENCOUNTER — Emergency Department (HOSPITAL_BASED_OUTPATIENT_CLINIC_OR_DEPARTMENT_OTHER)
Admission: EM | Admit: 2014-06-29 | Discharge: 2014-06-29 | Disposition: A | Discharge status: Home / Self Care | Payer: 59 | Attending: Emergency Medicine | Admitting: Emergency Medicine

## 2014-06-29 DIAGNOSIS — Z87891 Personal history of nicotine dependence: Secondary | ICD-10-CM | POA: Diagnosis not present

## 2014-06-29 DIAGNOSIS — R1013 Epigastric pain: Secondary | ICD-10-CM | POA: Diagnosis not present

## 2014-06-29 DIAGNOSIS — Z3202 Encounter for pregnancy test, result negative: Secondary | ICD-10-CM | POA: Diagnosis not present

## 2014-06-29 DIAGNOSIS — R102 Pelvic and perineal pain: Secondary | ICD-10-CM

## 2014-06-29 DIAGNOSIS — N898 Other specified noninflammatory disorders of vagina: Secondary | ICD-10-CM | POA: Insufficient documentation

## 2014-06-29 LAB — CBC WITH DIFFERENTIAL/PLATELET
BASOS ABS: 0 10*3/uL (ref 0.0–0.1)
Basophils Relative: 0 % (ref 0–1)
Eosinophils Absolute: 0.1 10*3/uL (ref 0.0–0.7)
Eosinophils Relative: 2 % (ref 0–5)
HCT: 39.1 % (ref 36.0–46.0)
Hemoglobin: 13.1 g/dL (ref 12.0–15.0)
Lymphocytes Relative: 32 % (ref 12–46)
Lymphs Abs: 1.9 10*3/uL (ref 0.7–4.0)
MCH: 30.5 pg (ref 26.0–34.0)
MCHC: 33.5 g/dL (ref 30.0–36.0)
MCV: 90.9 fL (ref 78.0–100.0)
MONOS PCT: 7 % (ref 3–12)
Monocytes Absolute: 0.4 10*3/uL (ref 0.1–1.0)
NEUTROS ABS: 3.5 10*3/uL (ref 1.7–7.7)
NEUTROS PCT: 59 % (ref 43–77)
Platelets: 223 10*3/uL (ref 150–400)
RBC: 4.3 MIL/uL (ref 3.87–5.11)
RDW: 12.1 % (ref 11.5–15.5)
WBC: 5.9 10*3/uL (ref 4.0–10.5)

## 2014-06-29 LAB — URINALYSIS, ROUTINE W REFLEX MICROSCOPIC
BILIRUBIN URINE: NEGATIVE
Glucose, UA: NEGATIVE mg/dL
Hgb urine dipstick: NEGATIVE
Ketones, ur: NEGATIVE mg/dL
LEUKOCYTES UA: NEGATIVE
Nitrite: NEGATIVE
PH: 6.5 (ref 5.0–8.0)
Protein, ur: NEGATIVE mg/dL
SPECIFIC GRAVITY, URINE: 1.013 (ref 1.005–1.030)
Urobilinogen, UA: 0.2 mg/dL (ref 0.0–1.0)

## 2014-06-29 LAB — COMPREHENSIVE METABOLIC PANEL
ALBUMIN: 4.1 g/dL (ref 3.5–5.2)
ALT: 12 U/L (ref 0–35)
ANION GAP: 6 (ref 5–15)
AST: 15 U/L (ref 0–37)
Alkaline Phosphatase: 38 U/L — ABNORMAL LOW (ref 39–117)
BILIRUBIN TOTAL: 0.6 mg/dL (ref 0.3–1.2)
BUN: 9 mg/dL (ref 6–23)
CHLORIDE: 105 mmol/L (ref 96–112)
CO2: 27 mmol/L (ref 19–32)
CREATININE: 0.83 mg/dL (ref 0.50–1.10)
Calcium: 9 mg/dL (ref 8.4–10.5)
GFR calc Af Amer: >90 mL/min (ref >90)
GFR calc non Af Amer: >90 mL/min (ref >90)
Glucose, Bld: 91 mg/dL (ref 70–99)
Potassium: 3.5 mmol/L (ref 3.5–5.1)
Sodium: 138 mmol/L (ref 135–145)
TOTAL PROTEIN: 6.8 g/dL (ref 6.0–8.3)

## 2014-06-29 LAB — WET PREP, GENITAL
Trich, Wet Prep: NONE SEEN
Yeast Wet Prep HPF POC: NONE SEEN

## 2014-06-29 LAB — LIPASE, BLOOD: LIPASE: 33 U/L (ref 11–59)

## 2014-06-29 LAB — PREGNANCY, URINE: PREG TEST UR: NEGATIVE

## 2014-06-29 MED ORDER — AZITHROMYCIN 250 MG PO TABS
1000.0000 mg | ORAL_TABLET | Freq: Once | ORAL | Status: AC
  Administered 2014-06-29: 1000 mg via ORAL
  Filled 2014-06-29: qty 4

## 2014-06-29 MED ORDER — METRONIDAZOLE 500 MG PO TABS: 500.0000 mg | ORAL_TABLET | Freq: Two times a day (BID) | ORAL | Status: DC

## 2014-06-29 MED ORDER — GI COCKTAIL ~~LOC~~
30.0000 mL | Freq: Once | ORAL | Status: AC
  Administered 2014-06-29: 30 mL via ORAL
  Filled 2014-06-29: qty 30

## 2014-06-29 MED ORDER — CEFTRIAXONE SODIUM 250 MG IJ SOLR
250.0000 mg | Freq: Once | INTRAMUSCULAR | Status: AC
  Administered 2014-06-29: 250 mg via INTRAMUSCULAR
  Filled 2014-06-29: qty 250

## 2014-06-29 MED ORDER — RANITIDINE HCL 150 MG PO CAPS: 150.0000 mg | ORAL_CAPSULE | Freq: Every day | ORAL | Status: DC

## 2014-06-29 NOTE — ED Notes (Signed)
Pt back from [place]

## 2014-06-29 NOTE — Discharge Instructions (Signed)
Abdominal Pain °Many things can cause abdominal pain. Usually, abdominal pain is not caused by a disease and will improve without treatment. It can often be observed and treated at home. Your health care provider will do a physical exam and possibly order blood tests and X-rays to help determine the seriousness of your pain. However, in many cases, more time must pass before a clear cause of the pain can be found. Before that point, your health care provider may not know if you need more testing or further treatment. °HOME CARE INSTRUCTIONS  °Monitor your abdominal pain for any changes. The following actions may help to alleviate any discomfort you are experiencing: °· Only take over-the-counter or prescription medicines as directed by your health care provider. °· Do not take laxatives unless directed to do so by your health care provider. °· Try a clear liquid diet (broth, tea, or water) as directed by your health care provider. Slowly move to a bland diet as tolerated. °SEEK MEDICAL CARE IF: °· You have unexplained abdominal pain. °· You have abdominal pain associated with nausea or diarrhea. °· You have pain when you urinate or have a bowel movement. °· You experience abdominal pain that wakes you in the night. °· You have abdominal pain that is worsened or improved by eating food. °· You have abdominal pain that is worsened with eating fatty foods. °· You have a fever. °SEEK IMMEDIATE MEDICAL CARE IF:  °· Your pain does not go away within 2 hours. °· You keep throwing up (vomiting). °· Your pain is felt only in portions of the abdomen, such as the right side or the left lower portion of the abdomen. °· You pass bloody or black tarry stools. °MAKE SURE YOU: °· Understand these instructions.   °· Will watch your condition.   °· Will get help right away if you are not doing well or get worse.   °Document Released: 12/06/2004 Document Revised: 03/03/2013 Document Reviewed: 11/05/2012 °ExitCare® Patient Information  ©2015 [employer]. This information is not intended to replace advice given to you by your health care provider. Make sure you discuss any questions you have with your health care provider. ° ° °Emergency Department Resource Guide °1) Find a Doctor and Pay Out of Pocket °Although you won't have to find out who is covered by your insurance plan, it is a good idea to ask around and get recommendations. You will then need to call the office and see if the doctor you have chosen will accept you as a new patient and what types of options they offer for patients who are self-pay. Some doctors offer discounts or will set up payment plans for their patients who do not have insurance, but you will need to ask so you aren't surprised when you get to your appointment. ° °2) Contact Your Local Health Department °Not all health departments have doctors that can see patients for sick visits, but many do, so it is worth a call to see if yours does. If you don't know where your local health department is, you can check in your phone book. The CDC also has a tool to help you locate your state's health department, and many state websites also have listings of all of their local health departments. ° °3) Find a Walk-in Clinic °If your illness is not likely to be very severe or complicated, you may want to try a walk in clinic. These are popping up all over the country in pharmacies, drugstores, and shopping centers. They're   usually staffed by nurse practitioners or physician assistants that have been trained to treat common illnesses and complaints. They're usually fairly quick and inexpensive. However, if you have serious medical issues or chronic medical problems, these are probably not your best option. ° °No Primary Care Doctor: °- Call [facility] at  832-8000 - they can help you locate a primary care doctor that  accepts your insurance, provides certain services, etc. °- Physician Referral Service- 1-800-533-3463 ° °Chronic  Pain Problems: °Organization         Address  Phone   Notes  °[facility]  (336) 297-2271 Patients need to be referred by their primary care doctor.  ° °Medication Assistance: °Organization         Address  Phone   Notes  °[place] [facility] [address] °[place], [place] [address] (336) 641-8030 --Must be a resident of [place] °-- Must have NO insurance coverage whatsoever (no Medicaid/ Medicare, etc.) °-- The pt. MUST have a primary care doctor that directs their care regularly and follows them in the community °  °[facility]  (866) 331-1348   °[employer]  (888) 892-1162   ° °Agencies that provide inexpensive medical care: °Organization         Address  Phone   Notes  °[facility]  (336) 832-8035   °[facility]    (336) 832-7272   °[facility] [address] °[place], [place] [address] (336) 832-4777   °[facility] [address], °[place] (336) 271-4999   °[facility]    (336) 373-0678   °[facility]    (336) 272-1050   °[facility] ° [address], [place] Phone:  (336) 832-4444, Fax:  (336) 832-4440 Hours of Operation:  9 am - 6 pm, M-F.  Also accepts Medicaid/Medicare and self-pay.  °[facility] ° [address], [place] Phone: (336) 832-3150, Fax: (336) 832-3151. Hours of Operation:  8:30 am - 5:30 pm, M-F.  Also accepts Medicaid and self-pay.  °[facility] [place] [address], [place] Phone: (336) 878-6027   °[facility] [address], [place], [place] (336)723-1848, Ext. 123 Mondays & Thursdays: 7-9 AM.  First 15 patients are seen on a first come, first serve basis. °  ° °Medicaid-accepting [place] Providers: ° °Organization         Address  Phone   Notes  °[facility] [address], [place] (336) 641-2100 Also  accepts self-pay patients.  °[facility] [address], [place] ° (336) 856-9996   °[facility] [address], [place] (336) 288-8857   °[facility] [address] [place] [address], [place] (336) 299-7000   °Veita Bland [address], [place]  ° (336) 373-1557 Only accepts [place] Access Medicaid patients after they have their name applied to their card.  ° °Self-Pay (no insurance) in [place]: ° °Organization         Address  Phone   Notes  °Sickle Cell Patients, [facility] [address], [place] (336) 832-1970   °[facility] [address], [place] (336) 832-4400   °[facility] [place] [facility] ° [address], [place] (336) 992-4800   °[facility]/Dr. Osei-Bonsu ° [address] [place] [address], [place] or [address], [place] (336) 841-8500 Phone number for both [place] and [place] locations is the same.  °[facility] [address], [place] (336) 299-0000   °[facility] [address] [place] [address], [place] or [address] (336) 852-7530 °(336) 878-2260   °[facility] [address], [place] (336) 350-1642, phone; (336) 294-5005, fax Sees patients 1st and 3rd Saturday of every month.  Must not qualify for public or private insurance (i.e. Medicaid, Medicare, [place] Health Choice, Veterans' Benefits) • Household income should be no more than 200% of the poverty level •The clinic cannot treat you if you are pregnant or think you are pregnant • Sexually transmitted diseases are not treated at the clinic.  ° ° °Dental Care: °Organization         Address  Phone  Notes  °[facility] [address], [place] (336) 641-6152 Accepts children up to age 21 who are enrolled in Medicaid or [place] Health Choice; pregnant  women with a Medicaid card; and children who have applied for Medicaid or [place] Health Choice, but were declined, whose parents can pay a reduced fee at time of service.  °[facility] [place]  [address], [place] (336) 641-7733 Accepts children up to age 21 who are enrolled in Medicaid or [place] Health Choice; pregnant women with a Medicaid card; and children who have applied for Medicaid or [place] Health Choice, but were declined, whose parents can pay a reduced fee at time of service.  °[facility] ° [address], [place] (336) 641-4533 Patients are seen by appointment only. Walk-ins are not accepted. [facility] will see patients 18 years of age and older. °Monday - Tuesday (8am-5pm) °Most Wednesdays (8:30-5pm) °$30 per visit, cash only  °[facility] ° [address], [place] (336) 641-4533 Patients are seen by appointment only. Walk-ins are not accepted. [facility] will see patients 18 years of age and older. °One Wednesday Evening (Monthly: Volunteer Based).  $30 per visit, cash only  °[facility]  (919) 537-3737 for adults; Children under age 4, call Graduate Pediatric Dentistry at ([phone]) [phone]. Children aged 4-14, please call (919) [phone] to request a pediatric application. ° Dental services are provided in all areas of dental care including fillings, crowns and bridges, complete and partial dentures, implants, gum treatment, root canals, and extractions. Preventive care is also provided. Treatment is provided to both adults and children. °Patients are selected via a lottery and there is often a waiting list. °  °[facility] [address], ° ° (336) 763-8833 [email or web address] °  °[facility] [address], [place], [place] (336)723-1848, Ext. 123 Second and Fourth Thursday of each month, opens at 6:30 AM; Clinic ends at 9 AM.  Patients are  seen on a first-come first-served basis, and a limited number are seen during each clinic.  ° °[facility] ° [address], [place], [place] (336) 723-7904   Eligibility Requirements °You must have lived in [place], [place], or [place] counties for at least the last three months. °  You cannot be eligible for state or federal sponsored healthcare insurance, including Veterans Administration, Medicaid, or Medicare. °  You generally cannot be eligible for healthcare insurance through   your employer.  °  How to apply: °Eligibility screenings are held every Tuesday and Wednesday afternoon from 1:00 pm until 4:00 pm. You do not need an appointment for the interview!  °[facility] [address], [place], [place] 336-631-2330   °[facility]  336-342-8273   °[facility]  336-703-3100   °[facility]  336-570-6415   ° °Behavioral Health Resources in the Community: °Intensive Outpatient Programs °Organization         Address  Phone  Notes  °[facility] [address], [place], [place] 336-878-6098   °[facility] [address], [place], [place] 336-832-9800   °[facility] [address], [place], [place] ° 336-882-2125   °[facility] [address],  °[place], [place] 1-800-853-5163 or 336-641-4981   °Substance Abuse Resources °Organization         Address  Phone  Notes  °[facility]  336-882-2125   °[facility]  336-784-9470   °[facility]  336-285-9073   °[facility]  336-845-3988   °Residential & Outpatient Substance Abuse Program  1-800-659-3381   °Psychological Services °Organization         Address  Phone  Notes  °[facility]  336- 832-9600   °[facility]  336- 378-7881   °[facility] [address], [place] 1-800-853-5163 or 336-641-4981   ° °Mobile Crisis  Teams °Organization         Address  Phone  Notes  °[facility], Mobile Crisis Care Unit  1-877-626-1772   °Assertive °[facility] ° [address] [place], [place] 336-834-9664   °Sharon DeEsch [address] °[place] 336-554-5454   ° °Self-Help/Support Groups °Organization         Address  Phone             Notes  °[facility] - variety of support groups  336- 373-1402 Call for more information  °Narcotics Anonymous (NA), [facility] [address], °[place]  2 meetings at this location  ° °Residential Treatment Programs °Organization         Address  Phone  Notes  °[facility] [address],    °[place]  1-866-801-8205   °[facility] ° [address], [place], [place] 704-293-8524   °[facility] [address], [place] 336-845-3988 Admissions: 8am-3pm M-F  °[facility] [address]    °[place], [place] 336-841-1104   °[facility] [address], [place], [place] 336-379-7146   °[facility] [address]  °[place], [place] 336-285-9073   °[facility] Intensive Outpatient [address], [place], [place] 336-852-3033   °[facility] [address]  °[place], [place] 1-877-615-2722 or 336-784-9470   °[facility] [address] [place], [place] 336-227-7417 Accepts Medicaid  °[facility] [address]  °[place] 1-800-659-3381 Substance Abuse/Addiction Treatment  ° °[place] Behavioral Health Resources °Organization         Address  Phone  Notes  °[facility]  (888) 581-9988   °Julie Brannon, PhD [address] [place], [place]   (336) 349-5553 or (336) 951-0000   °[facility]   [address] °[place], [place] (336) 349-4454   °  [facility] [address], [place], [place] (336) 342-8316  Insurance/Medicaid/sponsorship through [facility]  °[facility] [address]                                    [place], [place] (336) 342-8316 Therapy/tele-psych/case  °[facility] [address]  ° [place], [place] (336) 349-2233    °Dr. Arfeen  (336) 349-4544   °[facility] [place]  [employer] [place] [facility] 1) [address], [place] °2) [address], [place] °3)  [address], [place] (336) 349-3220 °(336) 342-7768 ° °(336) 342-8140   °[place] [facility] (336) 342-1394 or (336) 342-3537 (After Hours)    ° ° ° ° °

## 2014-06-29 NOTE — ED Notes (Signed)
C/o pelvic pain x 2 weeks-abd pain x 2 months

## 2014-06-29 NOTE — ED Provider Notes (Signed)
PT without significant pain on exam.  U/s shows involuting left ovarian cyst.  Discharge instructions given per Dr. Romeo AppleHarrison.  Rolan BuccoMelanie Sophy [place], MD 06/29/14 1758

## 2014-06-29 NOTE — ED Notes (Signed)
Pt remains in [place]

## 2014-06-29 NOTE — ED Provider Notes (Addendum)
CSN: [id number]     Arrival date & time 06/29/14  1329 History   First MD Initiated Contact with Patient 06/29/14 1406     Chief Complaint  Patient presents with  . Pelvic Pain     (Consider location/radiation/quality/duration/timing/severity/associated sxs/prior Treatment) Patient is a 24 y.o. female presenting with pelvic pain. The history is provided by the patient.  Pelvic Pain This is a new problem. The current episode started more than 1 week ago. The problem occurs constantly. The problem has not changed since onset.Associated symptoms include abdominal pain. Pertinent negatives include no chest pain, no headaches and no shortness of breath. Nothing aggravates the symptoms. Nothing relieves the symptoms. She has tried nothing for the symptoms. The treatment provided no relief.    History reviewed. No pertinent past medical history. No past surgical history on file. Family History  Problem Relation Age of Onset  . Hypertension Other   . Diabetes Other    History  Substance Use Topics  . Smoking status: Former Smoker    Quit date: 08/18/2013  . Smokeless tobacco: Not on file  . Alcohol Use: Yes     Comment: occ   OB History    No data available     Review of Systems  Constitutional: Negative for fever and fatigue.  HENT: Negative for congestion and drooling.   Eyes: Negative for pain.  Respiratory: Negative for cough and shortness of breath.   Cardiovascular: Negative for chest pain.  Gastrointestinal: Positive for nausea and abdominal pain. Negative for vomiting and diarrhea.  Genitourinary: Positive for dysuria (occasionally), vaginal discharge and pelvic pain. Negative for hematuria.  Musculoskeletal: Negative for back pain, gait problem and neck pain.  Skin: Negative for color change.  Neurological: Negative for dizziness and headaches.  Hematological: Negative for adenopathy.  Psychiatric/Behavioral: Negative for behavioral problems.  All other systems  reviewed and are negative.     Allergies  Food  Home Medications   Prior to Admission medications   Medication Sig Start Date End Date Taking? Authorizing Provider  albuterol (PROVENTIL HFA;VENTOLIN HFA) 108 (90 BASE) MCG/ACT inhaler Inhale into the lungs every 6 (six) hours as needed for wheezing or shortness of breath.    Historical Provider, MD   BP 105/57 mmHg  Pulse 80  Temp(Src) 98.1 F (36.7 C) (Oral)  Resp 16  Ht  (1.651 m)  Wt 108 lb (48.988 kg)  BMI 17.97 kg/m2  SpO2 100%  LMP 05/20/2014 Physical Exam  Constitutional: She is oriented to person, place, and time. She appears well-developed and well-nourished.  HENT:  Head: Normocephalic and atraumatic.  Mouth/Throat: Oropharynx is clear and moist. No oropharyngeal exudate.  Eyes: Conjunctivae and EOM are normal. Pupils are equal, round, and reactive to light.  Neck: Normal range of motion. Neck supple.  Cardiovascular: Normal rate, regular rhythm, normal heart sounds and intact distal pulses.  Exam reveals no gallop and no friction rub.   No murmur heard. Pulmonary/Chest: Effort normal and breath sounds normal. No respiratory distress. She has no wheezes.  Abdominal: Soft. Bowel sounds are normal. There is no tenderness. There is no rebound and no guarding.  Genitourinary:  Normal-appearing external vagina. Normal-appearing cervix. Os closed. No cervical motion tenderness. Diffuse mild tenderness during the bimanual exam. Pain does not seem to localize.  Musculoskeletal: Normal range of motion. She exhibits no edema or tenderness.  Neurological: She is alert and oriented to person, place, and time.  Skin: Skin is warm and dry.  Psychiatric: She has  a normal mood and affect. Her behavior is normal.  Nursing note and vitals reviewed.   ED Course  Procedures (including critical care time) Labs Review Labs Reviewed  WET PREP, GENITAL - Abnormal; Notable for the following:    Clue Cells Wet Prep HPF POC FEW  (*)    WBC, Wet Prep HPF POC FEW (*)    All other components within normal limits  COMPREHENSIVE METABOLIC PANEL - Abnormal; Notable for the following:    Alkaline Phosphatase 38 (*)    All other components within normal limits  URINALYSIS, ROUTINE W REFLEX MICROSCOPIC  PREGNANCY, URINE  CBC WITH DIFFERENTIAL/PLATELET  LIPASE, BLOOD  GC/CHLAMYDIA PROBE AMP ([place])    Imaging Review [place] Transvaginal Non-ob  06/29/2014   CLINICAL DATA:  Generalized pelvic pain.  EXAM: TRANSABDOMINAL AND TRANSVAGINAL ULTRASOUND OF PELVIS  DOPPLER ULTRASOUND OF OVARIES  TECHNIQUE: Both transabdominal and transvaginal ultrasound examinations of the pelvis were performed. Transabdominal technique was performed for global imaging of the pelvis including uterus, ovaries, adnexal regions, and pelvic cul-de-sac.  It was necessary to proceed with endovaginal exam following the transabdominal exam to visualize the endometrium and ovaries. Color and duplex Doppler ultrasound was utilized to evaluate blood flow to the ovaries.  COMPARISON:  None.  FINDINGS: Uterus  Measurements: 7.2 x 4.2 x 5.7 cm. No fibroids or other mass visualized.  Endometrium  Thickness: 1.46 cm.  No focal abnormality visualized.  Right ovary  Measurements: 3.2 x 1.6 x 2.9 cm. Normal appearance/no adnexal mass.  Left ovary  Measurements: 3.5 x 1.5 x 2.7 cm. There is an involuting left ovarian cyst.  Pulsed Doppler evaluation of both ovaries demonstrates normal low-resistance arterial and venous waveforms.  Other findings  There is a small amount of pelvic free fluid likely physiologic.  IMPRESSION: 1. No ovarian torsion. 2. Involuting left ovarian cyst.   Electronically Signed   By: Elige KoHetal  Patel   On: 06/29/2014 17:46   [place] Pelvis Complete  06/29/2014   CLINICAL DATA:  Generalized pelvic pain.  EXAM: TRANSABDOMINAL AND TRANSVAGINAL ULTRASOUND OF PELVIS  DOPPLER ULTRASOUND OF OVARIES  TECHNIQUE: Both transabdominal and transvaginal ultrasound  examinations of the pelvis were performed. Transabdominal technique was performed for global imaging of the pelvis including uterus, ovaries, adnexal regions, and pelvic cul-de-sac.  It was necessary to proceed with endovaginal exam following the transabdominal exam to visualize the endometrium and ovaries. Color and duplex Doppler ultrasound was utilized to evaluate blood flow to the ovaries.  COMPARISON:  None.  FINDINGS: Uterus  Measurements: 7.2 x 4.2 x 5.7 cm. No fibroids or other mass visualized.  Endometrium  Thickness: 1.46 cm.  No focal abnormality visualized.  Right ovary  Measurements: 3.2 x 1.6 x 2.9 cm. Normal appearance/no adnexal mass.  Left ovary  Measurements: 3.5 x 1.5 x 2.7 cm. There is an involuting left ovarian cyst.  Pulsed Doppler evaluation of both ovaries demonstrates normal low-resistance arterial and venous waveforms.  Other findings  There is a small amount of pelvic free fluid likely physiologic.  IMPRESSION: 1. No ovarian torsion. 2. Involuting left ovarian cyst.   Electronically Signed   By: Elige KoHetal  Patel   On: 06/29/2014 17:46   [place] Art/ven Flow Abd Pelv Doppler  06/29/2014   CLINICAL DATA:  Generalized pelvic pain.  EXAM: TRANSABDOMINAL AND TRANSVAGINAL ULTRASOUND OF PELVIS  DOPPLER ULTRASOUND OF OVARIES  TECHNIQUE: Both transabdominal and transvaginal ultrasound examinations of the pelvis were performed. Transabdominal technique was performed for global imaging of the pelvis  including uterus, ovaries, adnexal regions, and pelvic cul-de-sac.  It was necessary to proceed with endovaginal exam following the transabdominal exam to visualize the endometrium and ovaries. Color and duplex Doppler ultrasound was utilized to evaluate blood flow to the ovaries.  COMPARISON:  None.  FINDINGS: Uterus  Measurements: 7.2 x 4.2 x 5.7 cm. No fibroids or other mass visualized.  Endometrium  Thickness: 1.46 cm.  No focal abnormality visualized.  Right ovary  Measurements: 3.2 x 1.6 x 2.9 cm. Normal  appearance/no adnexal mass.  Left ovary  Measurements: 3.5 x 1.5 x 2.7 cm. There is an involuting left ovarian cyst.  Pulsed Doppler evaluation of both ovaries demonstrates normal low-resistance arterial and venous waveforms.  Other findings  There is a small amount of pelvic free fluid likely physiologic.  IMPRESSION: 1. No ovarian torsion. 2. Involuting left ovarian cyst.   Electronically Signed   By: Elige Ko   On: 06/29/2014 17:46     EKG Interpretation None      MDM   Final diagnoses:  Pelvic pain in female  Epigastric pain    2:37 PM 24 y.o. female who presents with complaint of abdominal pain and pelvic pain. She states that she has had intermittent epigastric pain for the last 6-9 months. She was seen here last November with a noncontributory workup. She states that it seems to be worse in the morning and alleviates throughout the day. Also worse with food. She denies any fevers or vomiting. She does have some nausea. She also notes development of nonfocal pelvic pain over the last 3 weeks and vaginal discharge. She is sexually active but denies any history of STDs. Epigastric pain appears to be chronic. Possibly gastritis will give a GI cocktail. Will perform pelvic and get pelvic ultrasound as well. She declines any pain medicine.  Dr. Fredderick Phenix assumed care while awaiting [place]. Will tx w/ zantac for chronic epig pain, resources given for pcp. Flagyl for vag d/c, clue cells on wet prep.   Purvis Sheffield, MD 06/30/14 1914  Purvis Sheffield, MD 06/30/14 [phone]

## 2014-06-30 LAB — GC/CHLAMYDIA PROBE AMP (~~LOC~~) NOT AT ARMC
CHLAMYDIA, DNA PROBE: NEGATIVE
NEISSERIA GONORRHEA: NEGATIVE

## 2014-07-13 ENCOUNTER — Telehealth (HOSPITAL_BASED_OUTPATIENT_CLINIC_OR_DEPARTMENT_OTHER): Payer: Self-pay | Admitting: Emergency Medicine

## 2014-07-15 ENCOUNTER — Emergency Department (HOSPITAL_BASED_OUTPATIENT_CLINIC_OR_DEPARTMENT_OTHER)
Admission: EM | Admit: 2014-07-15 | Discharge: 2014-07-15 | Disposition: A | Discharge status: Home / Self Care | Payer: 59 | Attending: Emergency Medicine | Admitting: Emergency Medicine

## 2014-07-15 ENCOUNTER — Encounter (HOSPITAL_BASED_OUTPATIENT_CLINIC_OR_DEPARTMENT_OTHER): Payer: Self-pay | Admitting: *Deleted

## 2014-07-15 DIAGNOSIS — Z792 Long term (current) use of antibiotics: Secondary | ICD-10-CM | POA: Diagnosis not present

## 2014-07-15 DIAGNOSIS — X58XXXA Exposure to other specified factors, initial encounter: Secondary | ICD-10-CM | POA: Diagnosis not present

## 2014-07-15 DIAGNOSIS — R109 Unspecified abdominal pain: Secondary | ICD-10-CM | POA: Insufficient documentation

## 2014-07-15 DIAGNOSIS — T161XXA Foreign body in right ear, initial encounter: Secondary | ICD-10-CM | POA: Diagnosis not present

## 2014-07-15 DIAGNOSIS — Y9289 Other specified places as the place of occurrence of the external cause: Secondary | ICD-10-CM | POA: Diagnosis not present

## 2014-07-15 DIAGNOSIS — Y998 Other external cause status: Secondary | ICD-10-CM | POA: Insufficient documentation

## 2014-07-15 DIAGNOSIS — Z79899 Other long term (current) drug therapy: Secondary | ICD-10-CM | POA: Insufficient documentation

## 2014-07-15 DIAGNOSIS — Y9389 Activity, other specified: Secondary | ICD-10-CM | POA: Insufficient documentation

## 2014-07-15 DIAGNOSIS — S00451A Superficial foreign body of right ear, initial encounter: Secondary | ICD-10-CM

## 2014-07-15 DIAGNOSIS — Z3202 Encounter for pregnancy test, result negative: Secondary | ICD-10-CM | POA: Diagnosis not present

## 2014-07-15 LAB — URINALYSIS, ROUTINE W REFLEX MICROSCOPIC
Bilirubin Urine: NEGATIVE
Glucose, UA: NEGATIVE mg/dL
Hgb urine dipstick: NEGATIVE
KETONES UR: NEGATIVE mg/dL
LEUKOCYTES UA: NEGATIVE
NITRITE: NEGATIVE
Protein, ur: NEGATIVE mg/dL
SPECIFIC GRAVITY, URINE: 1.012 (ref 1.005–1.030)
UROBILINOGEN UA: 0.2 mg/dL (ref 0.0–1.0)
pH: 7 (ref 5.0–8.0)

## 2014-07-15 LAB — PREGNANCY, URINE: Preg Test, Ur: NEGATIVE

## 2014-07-15 MED ORDER — CEPHALEXIN 500 MG PO CAPS: 500.0000 mg | ORAL_CAPSULE | Freq: Four times a day (QID) | ORAL | Status: DC

## 2014-07-15 MED ORDER — LIDOCAINE VISCOUS 2 % MT SOLN: 15.0000 mL | Freq: Once | OROMUCOSAL | Status: DC

## 2014-07-15 MED ORDER — LIDOCAINE HCL (PF) 1 % IJ SOLN
5.0000 mL | Freq: Once | INTRAMUSCULAR | Status: AC
  Administered 2014-07-15: 5 mL
  Filled 2014-07-15: qty 5

## 2014-07-15 NOTE — ED Notes (Signed)
Earring to anterior aspect of right ear noted.  Unable to remove and tender when touched.  Mild erythema

## 2014-07-15 NOTE — ED Notes (Signed)
She has an earring stuck in her external ear. She is also complaining of abdominal pain that she has had on and off for 8 months. States she has not seen a doctor for the pain. Friend reminded her she just saw a doctor and was told she has an ovarian cyst.

## 2014-07-15 NOTE — ED Provider Notes (Signed)
CSN: [id number]     Arrival date & time 07/15/14  1344 History   First MD Initiated Contact with Patient 07/15/14 1347     Chief Complaint  Patient presents with  . Foreign Body in Skin  . Abdominal Pain     (Consider location/radiation/quality/duration/timing/severity/associated sxs/prior Treatment) HPI    PCP: No PCP Per Patient Blood pressure 101/57, pulse 84, temperature 98.3 F (36.8 C), temperature source Oral, resp. rate 18, weight 108 lb (48.988 kg), last menstrual period 06/24/2014, SpO2 98 %.  Linda Wallace is a 24 y.o.female with a significant PMH of hypertension, diabetes, ovarian cysts presents to the ER with complaints of complaints of FB in right ear and abdominal pain. She reports her earring in the right ear being pulled into the middle of her ear. Pain and redness associated with clear drainage. Her stomach was hurting for a few minutes prior to arrival but it resolved on its own. She reports the feelings  Was similar to those she's been having for the past 8 months. She wast told that she has ovarian cysts. She is due to see her mothers doctor within the next month for further evaluation of her chronic abdominal pain. She does not feel that she needs to be seen for abdominal pain today since the pain has resolved.  Negative Review of Symptoms:  Nausea, vomiting, diarrhea, fevers, weakness, lower extremity swelling, chest pain, shortness of breath, headache, neck pain.    History reviewed. No pertinent past medical history. History reviewed. No pertinent past surgical history. Family History  Problem Relation Age of Onset  . Hypertension Other   . Diabetes Other    History  Substance Use Topics  . Smoking status: Former Smoker    Quit date: 08/18/2013  . Smokeless tobacco: Not on file  . Alcohol Use: Yes     Comment: occ   OB History    No data available     Review of Systems    Allergies  Food  Home Medications   Prior to Admission medications     Medication Sig Start Date End Date Taking? Authorizing Provider  albuterol (PROVENTIL HFA;VENTOLIN HFA) 108 (90 BASE) MCG/ACT inhaler Inhale into the lungs every 6 (six) hours as needed for wheezing or shortness of breath.    Historical Provider, MD  cephALEXin (KEFLEX) 500 MG capsule Take 1 capsule (500 mg total) by mouth 4 (four) times daily. 07/15/14   Reuven Braver Neva SeatGreene, PA-C  metroNIDAZOLE (FLAGYL) 500 MG tablet Take 1 tablet (500 mg total) by mouth 2 (two) times daily. One po bid x 7 days 06/29/14   Purvis SheffieldForrest Harrison, MD  ranitidine (ZANTAC) 150 MG capsule Take 1 capsule (150 mg total) by mouth daily. 06/29/14   Purvis SheffieldForrest Harrison, MD   BP 101/57 mmHg  Pulse 84  Temp(Src) 98.3 F (36.8 C) (Oral)  Resp 18  Wt 108 lb (48.988 kg)  SpO2 98%  LMP 06/24/2014 Physical Exam  Constitutional: She appears well-developed and well-nourished. No distress.  HENT:  Head: Normocephalic and atraumatic.  Ears:  Eyes: Pupils are equal, round, and reactive to light.  Neck: Normal range of motion. Neck supple.  Cardiovascular: Normal rate and regular rhythm.   Pulmonary/Chest: Effort normal.  Abdominal: Soft.   Patient's abdomen is soft, nondistended, nontender. NABS  Neurological: She is alert.  Skin: Skin is warm and dry.  Nursing note and vitals reviewed.   ED Course  FOREIGN BODY REMOVAL Date/Time: 07/15/2014 2:53 PM Performed by: Marlon PelGREENE, Braylinn Gulden Authorized by: Neva SeatGREENE,  Vangie Henthorn Consent: Verbal consent obtained. Risks and benefits: risks, benefits and alternatives were discussed Consent given by: patient Patient identity confirmed: verbally with patient and arm band Body area: ear Location details: right ear Anesthesia: local infiltration Local anesthetic: lidocaine 1% without epinephrine Anesthetic total: 1 ml Patient sedated: no Patient restrained: no Complexity: simple 1 objects recovered. Objects recovered: earring Post-procedure assessment: foreign body removed Comments: Pt tolerated  procedure well with only a small amount of associated bleeding.   (including critical care time) Labs Review Labs Reviewed  URINALYSIS, ROUTINE W REFLEX MICROSCOPIC  PREGNANCY, URINE    Imaging Review No results found.   EKG Interpretation None      MDM   Final diagnoses:  Foreign body in ear lobe, right, initial encounter   Started on keflex, ring removed. Afebrile, normal vitals.  23 y.o.Linda Wallace's evaluation in the Emergency Department is complete. It has been determined that no acute conditions requiring further emergency intervention are present at this time. The patient/guardian have been advised of the diagnosis and plan. We have discussed signs and symptoms that warrant return to the ED, such as changes or worsening in symptoms.  Vital signs are stable at discharge. Filed Vitals:   07/15/14 1354  BP: 101/57  Pulse: 84  Temp: 98.3 F (36.8 C)  Resp: 18    Patient/guardian has voiced understanding and agreed to follow-up with the PCP or specialist.     Marlon Peliffany Arinze Rivadeneira, PA-C 07/15/14 1558  Gilda Creasehristopher J Pollina, MD 07/15/14 1600

## 2014-07-15 NOTE — Discharge Instructions (Signed)
Ear Foreign Body °An ear foreign body is an object that is stuck in the ear. It is common for young children to put objects into the ear canal. These may include pebbles, beads, beans, and any other small objects which will fit. In adults, objects such as cotton swabs may become lodged in the ear canal. In all ages, the most common foreign bodies are insects that enter the ear canal.  °SYMPTOMS  °Foreign bodies may cause pain, buzzing or roaring sounds, hearing loss, and ear drainage.  °HOME CARE INSTRUCTIONS  °· Keep all follow-up appointments with your caregiver as told. °· Keep small objects out of reach of young children. Tell them not to put anything in their ears. °SEEK IMMEDIATE MEDICAL CARE IF:  °· You have bleeding from the ear. °· You have increased pain or swelling of the ear. °· You have reduced hearing. °· You have discharge coming from the ear. °· You have a fever. °· You have a headache. °MAKE SURE YOU:  °· Understand these instructions. °· Will watch your condition. °· Will get help right away if you are not doing well or get worse. °Document Released: 02/24/2000 Document Revised: 05/21/2011 Document Reviewed: 10/15/2007 °[employer]® Patient Information ©2015 [employer]. This information is not intended to replace advice given to you by your health care provider. Make sure you discuss any questions you have with your health care provider. ° °

## 2014-07-15 NOTE — ED Notes (Signed)
Patient preparing for discharge. 

## 2014-07-30 ENCOUNTER — Encounter (HOSPITAL_COMMUNITY): Payer: Self-pay | Admitting: Emergency Medicine

## 2014-07-30 ENCOUNTER — Emergency Department (HOSPITAL_COMMUNITY): Payer: 59

## 2014-07-30 ENCOUNTER — Emergency Department (HOSPITAL_COMMUNITY)
Admission: EM | Admit: 2014-07-30 | Discharge: 2014-07-31 | Disposition: A | Discharge status: Home / Self Care | Payer: 59 | Attending: Emergency Medicine | Admitting: Emergency Medicine

## 2014-07-30 DIAGNOSIS — Z87891 Personal history of nicotine dependence: Secondary | ICD-10-CM | POA: Insufficient documentation

## 2014-07-30 DIAGNOSIS — R0789 Other chest pain: Secondary | ICD-10-CM | POA: Diagnosis not present

## 2014-07-30 DIAGNOSIS — Z792 Long term (current) use of antibiotics: Secondary | ICD-10-CM | POA: Diagnosis not present

## 2014-07-30 DIAGNOSIS — Z79899 Other long term (current) drug therapy: Secondary | ICD-10-CM | POA: Insufficient documentation

## 2014-07-30 DIAGNOSIS — Z3202 Encounter for pregnancy test, result negative: Secondary | ICD-10-CM | POA: Diagnosis not present

## 2014-07-30 DIAGNOSIS — R079 Chest pain, unspecified: Secondary | ICD-10-CM | POA: Diagnosis present

## 2014-07-30 LAB — CBC
HCT: 41.3 % (ref 36.0–46.0)
Hemoglobin: 14.1 g/dL (ref 12.0–15.0)
MCH: 30.3 pg (ref 26.0–34.0)
MCHC: 34.1 g/dL (ref 30.0–36.0)
MCV: 88.8 fL (ref 78.0–100.0)
PLATELETS: 214 10*3/uL (ref 150–400)
RBC: 4.65 MIL/uL (ref 3.87–5.11)
RDW: 12.7 % (ref 11.5–15.5)
WBC: 7.5 10*3/uL (ref 4.0–10.5)

## 2014-07-30 LAB — BASIC METABOLIC PANEL
ANION GAP: 12 (ref 5–15)
BUN: 5 mg/dL — ABNORMAL LOW (ref 6–20)
CALCIUM: 9.4 mg/dL (ref 8.9–10.3)
CO2: 21 mmol/L — AB (ref 22–32)
Chloride: 105 mmol/L (ref 101–111)
Creatinine, Ser: 0.78 mg/dL (ref 0.44–1.00)
GFR calc Af Amer: >60 mL/min (ref >60)
GLUCOSE: 97 mg/dL (ref 65–99)
Potassium: 3.5 mmol/L (ref 3.5–5.1)
SODIUM: 138 mmol/L (ref 135–145)

## 2014-07-30 LAB — I-STAT TROPONIN, ED: Troponin i, poc: 0 ng/mL (ref 0.00–0.08)

## 2014-07-30 NOTE — ED Notes (Signed)
Pt from home for eval of substernal chest pressure that started today, pt reports sob and dizziness when pain started, pain has been constant. No cardiac history noted. nad noted.

## 2014-07-31 LAB — POC URINE PREG, ED: Preg Test, Ur: NEGATIVE

## 2014-07-31 MED ORDER — OMEPRAZOLE 20 MG PO CPDR: 20.0000 mg | DELAYED_RELEASE_CAPSULE | Freq: Every day | ORAL | Status: DC

## 2014-07-31 MED ORDER — GI COCKTAIL ~~LOC~~
30.0000 mL | Freq: Once | ORAL | Status: AC
  Administered 2014-07-31: 30 mL via ORAL
  Filled 2014-07-31: qty 30

## 2014-07-31 MED ORDER — NAPROXEN 500 MG PO TABS: 500.0000 mg | ORAL_TABLET | Freq: Two times a day (BID) | ORAL | Status: DC

## 2014-07-31 MED ORDER — NAPROXEN 250 MG PO TABS
500.0000 mg | ORAL_TABLET | Freq: Once | ORAL | Status: AC
  Administered 2014-07-31: 500 mg via ORAL
  Filled 2014-07-31: qty 2

## 2014-07-31 NOTE — Discharge Instructions (Signed)
These follow the directions provided. Use resource guide or the referral given to establish care with a primary care doctor. He may use the naproxen twice a day to help with pain and inflammation. You may use the omeprazole daily to help with any stomach upset. Don't hesitate to return for any new, worsening, or concerning symptoms.    SEEK IMMEDIATE MEDICAL CARE IF:  You have increased chest pain or pain that spreads to your arm, neck, jaw, back, or abdomen.  You have shortness of breath.  You have an increasing cough, or you cough up blood.  You have severe back or abdominal pain.  You feel nauseous or vomit.  You have severe weakness.  You faint.  You have chills. This is an emergency. Do not wait to see if the pain will go away. Get medical help at once. Call your local emergency services (911 in U.S.). Do not drive yourself to the hospital.   Emergency Department Resource Guide 1) Find a Doctor and Pay Out of Pocket Although you won't have to find out who is covered by your insurance plan, it is a good idea to ask around and get recommendations. You will then need to call the office and see if the doctor you have chosen will accept you as a new patient and what types of options they offer for patients who are self-pay. Some doctors offer discounts or will set up payment plans for their patients who do not have insurance, but you will need to ask so you aren't surprised when you get to your appointment.  2) Contact Your Local Health Department Not all health departments have doctors that can see patients for sick visits, but many do, so it is worth a call to see if yours does. If you don't know where your local health department is, you can check in your phone book. The CDC also has a tool to help you locate your state's health department, and many state websites also have listings of all of their local health departments.  3) Find a Walk-in Clinic If your illness is not likely to be very  severe or complicated, you may want to try a walk in clinic. These are popping up all over the country in pharmacies, drugstores, and shopping centers. They're usually staffed by nurse practitioners or physician assistants that have been trained to treat common illnesses and complaints. They're usually fairly quick and inexpensive. However, if you have serious medical issues or chronic medical problems, these are probably not your best option.  No Primary Care Doctor: - Call Health Connect at  [phone] - they can help you locate a primary care doctor that  accepts your insurance, provides certain services, etc. - Physician Referral Service- [phone]  Chronic Pain Problems: Organization         Address  Phone   Notes  [facility]  [phone] Patients need to be referred by their primary care doctor.   Medication Assistance: Organization         Address  Phone   Notes  [facility] Medication [facility] [address] [place] [place], [place] [address] [phone] --Must be a resident of [facility] -- Must have NO insurance coverage whatsoever (no Medicaid/ Medicare, etc.) -- The pt. MUST have a primary care doctor that directs their care regularly and follows them in the community   MedAssist  [phone]   [employer]  [phone]    Agencies that  provide inexpensive medical care: Organization         Address  Phone   Notes  [facility]  [phone]) [phone]   [facility]    [phone]   [facility] [address] [place], [place] [address] [phone]   [facility] [address], [place] [phone]   Planned Parenthood    [phone]   [facility]    [phone]   [facility]  [address], [place] Phone:  [phone], Fax:  [phone] Hours of Operation:  9 am - 6 pm, M-F.  Also accepts  Medicaid/Medicare and self-pay.  [facility]  [address], [place] Phone: [phone], Fax: [phone]. Hours of Operation:  8:30 am - 5:30 pm, M-F.  Also accepts Medicaid and self-pay.  [facility] [place] [address], [place] Phone: [phone]) [phone]   [facility] [address], [place], [place] [phone], Ext. [phone] Mondays & Thursdays: 7-9 AM.  First 15 patients are seen on a first come, first serve basis.    Medicaid-accepting [facility] [place] Providers:  Organization         Address  Phone   Notes  [facility] [address], [place] [phone] Also accepts self-pay patients.  [facility] [address], [place]  [phone]   [facility] [address], [place] [phone]   [facility] [address] [place] [address], [place] [phone]) [phone]   Renaye RakersVeita Bland [address], [place]   [phone]) [phone] Only accepts [place] Access [place]icaid patients after they have their name applied to their card.   Self-Pay (no insurance) in [facility]:  Organization         Address  Phone   Notes  Sickle Cell Patients, [facility] [address], [place] [phone]   [facility] [address], [place] [phone]   [facility] [place]  [address], [place] [phone]   [facility]/Dr. Osei-Bonsu  [address] [place] [address], [place] or [phone], [place] [phone] Phone number for both [place] and [place] locations is the same.  [facility] [address], [place] [phone]   [facility] [address] [place] [address], [place] or [address] [phone]) [phone]   [facility] [address], [place] [phone])  [phone], phone; [phone]) [phone], fax Sees patients 1st and 3rd Saturday of every month.  Must not qualify for public or private insurance (i.e. Medicaid, Medicare, [place] Health Choice, Veterans' Benefits)  Household income should be no more than 200% of the poverty level The clinic cannot treat you if you are pregnant or think you are pregnant  Sexually transmitted diseases are not treated at the clinic.    Dental Care: Organization         Address  Phone  Notes  [facility] of [facility] [address], [place] [phone]) [phone] Accepts children up to age 22 who are enrolled in [place]icaid or [place] Health Choice; pregnant women with a Medicaid card; and children who have applied for Medicaid or [place] Health  Choice, but were declined, whose parents can pay a reduced fee at time of service.  [facility] [place]  [address], [place] [phone] Accepts children up to age 22 who are enrolled in [place]icaid or [place] Health Choice; pregnant women with a Medicaid card; and children who have applied for Medicaid or [place] Health Choice, but were declined, whose parents can pay a reduced fee at time of service.  [facility]  [address], [place] [phone] Patients are seen by appointment only. Walk-ins are not accepted. [facility] will see patients 22 years of age and older. Monday - Tuesday (8am-5pm) Most Wednesdays (8:30-5pm) $30 per visit, cash only  [facility]  [address], [facility] [phone] Patients are seen by appointment only. Walk-ins are not accepted. [facility] will see patients 22 years of age and older. One Wednesday Evening (Monthly: Volunteer Based).  $30 per visit, cash only  [employer] [facility]  [phone] for adults; Children under age 704, call Graduate Pediatric Dentistry at [phone]. Children aged  394-14, please call [phone] to request a pediatric application.  Dental services are provided in all areas of dental care including fillings, crowns and bridges, complete and partial dentures, implants, gum treatment, root canals, and extractions. Preventive care is also provided. Treatment is provided to both adults and children. Patients are selected via a lottery and there is often a waiting list.   [facility] [address], [place]  [phone] [email or web address]   [facility] [address], [place], [place] [phone], Ext. [phone] Second and Fourth Thursday of each month, opens at 6:30 AM; Clinic ends at 9 AM.  Patients are seen on a first-come first-served basis, and a limited number are seen during each clinic.   [facility]  [address], [place], [place] [phone]   Eligibility Requirements You must have lived in [place], [place], or [place] counties for at least the last three months.   You cannot be eligible for state or federal sponsored National Cityhealthcare insurance, including CIGNAVeterans Administration, [place]icaid, or [employer]Medicare.   You generally cannot be eligible for healthcare insurance through your employer.    How to apply: Eligibility screenings are held every Tuesday and Wednesday afternoon from 1:00 pm until 4:00 pm. You do not need an appointment for the interview!  [facility] [address] [place] [address], [place], [place] [phone]   [facility]  [phone]   [facility]  [phone]   [facility]  [phone]    Behavioral Health Resources in the Community: Intensive Outpatient Programs Organization         Address  Phone  Notes  [facility] [address], [place], [place] [phone]   [facility] [address], [place], [place] [phone]   [facility] [address],  [place], [place]  [phone]   [facility] [address],  [place], [place] [phone] or [phone]   Substance Abuse Resources Organization         Address  Phone  Notes  [facility] Drug Services  [phone]   [facility]  [phone]   [facility]  [phone]   [facility]  [phone]   Residential & Outpatient Substance Abuse Program  [phone]  Psychological Services Organization         Address  Phone  Notes  [facility]  [phone]   [facility]  [phone]- [phone]   [facility] [phone] N[address], [place] [phone] or [phone]    Mobile Crisis Teams Organization         Address  Phone  Notes  [facility], Mobile Crisis Care Unit  [phone]   [facility]  [address] [place], [place] [phone]   Doristine LocksSharon DeEsch [address] [place] [phone]    Self-Help/Support Groups Organization         Address  Phone             Notes  [facility] of [place] - variety of support groups  [phone] Call for more information  Narcotics Anonymous (NA), Caring [facility] [address], [place]  2 meetings at this location   Statisticianesidential Treatment Programs Organization         Address  Phone  Notes  [facility] [address],    [place]  [phone]   [facility]  [address], [place] [phone], [place], [place] [phone]   [facility] [address], [place] [phone] Admissions: 8am-3pm M-F  [facility] [address]    [place], [place] [phone]   [facility] [address], [place], [place] [phone]   [facility] [address]  [place], [place] [phone]   [facility] - Intensive Outpatient [address], [place], [place] [phone]   [facility]  (Addiction Recovery Care [facility] [address]  [place], [place] [phone] or [phone]   [facility] ([facility]) [address] [place], [place] [phone] Accepts Medicaid  [facility] [address]  [place] [phone] Substance Abuse/Addiction Treatment   [facility] Behavioral Health Resources Organization         Address  Phone  Notes  [facility]  [phone]   Angie FavaJulie Brannon, PhD [address], [address] [place], [place]   [phone]) [phone] or [phone]   [facility]   [address] [place], [place] [phone]   [facility] [address], [place], [place] [phone] Insurance/Medicaid/sponsorship through [facility]  [facility] Families [address]                                    [place], [place] [phone] Therapy/tele-psych/case  [facility] [address]   [place], [place] [phone]    Dr. Lolly MustacheArfeen  [phone](336) [phone]   [facility] [place]  [employer] [facility] 1) [address], [place] 2) [address], [place] 3)  [address], [place] [phone](336) [phone]  [phone]   [facility] Child Abuse Hotline [phone] or [phone]) [phone] (After Hours)

## 2014-07-31 NOTE — ED Provider Notes (Signed)
CSN: [id number]     Arrival date & time 07/30/14  2221 History   First MD Initiated Contact with Patient 07/31/14 0037     Chief Complaint  Patient presents with  . Chest Pain   (Consider location/radiation/quality/duration/timing/severity/associated sxs/prior Treatment) HPI  Linda Wallace is a 24 year old female with no sifnificant past medical history, presenting with sudden onset chest pain. States she was riding in car an hour prior to arrival, when she felt a sudden sharp pain in her chest. The pain remained for about an hour and has gradually improved. She rates the pain initially as a 10 out of 10. She rates pain currently as a 4 out of 10. She describes the pain as sharp, but it does not radiate anywhere. She does endorse smoking half a pack of cigarettes a day. She reports drinking a glass of wine every week. She also endorses using marijuana daily. She denies shortness of breath, cough, hemoptysis, recent trauma, exogenous estrogen, unilateral leg swelling or pain.  History reviewed. No pertinent past medical history. History reviewed. No pertinent past surgical history. Family History  Problem Relation Age of Onset  . Hypertension Other   . Diabetes Other    History  Substance Use Topics  . Smoking status: Former Smoker -- 0.50 packs/day    Types: Cigarettes    Quit date: 08/18/2013  . Smokeless tobacco: Not on file  . Alcohol Use: Yes     Comment: occ   OB History    No data available     Review of Systems  Constitutional: Negative for fever and chills.  HENT: Negative for sore throat.   Eyes: Negative for visual disturbance.  Respiratory: Negative for cough and shortness of breath.   Cardiovascular: Positive for chest pain. Negative for leg swelling.  Gastrointestinal: Negative for nausea, vomiting and diarrhea.  Genitourinary: Negative for dysuria.  Musculoskeletal: Negative for myalgias.  Skin: Negative for rash.  Neurological: Negative for weakness, numbness  and headaches.      Allergies  Food  Home Medications   Prior to Admission medications   Medication Sig Start Date End Date Taking? Authorizing Provider  albuterol (PROVENTIL HFA;VENTOLIN HFA) 108 (90 BASE) MCG/ACT inhaler Inhale into the lungs every 6 (six) hours as needed for wheezing or shortness of breath.    Historical Provider, MD  cephALEXin (KEFLEX) 500 MG capsule Take 1 capsule (500 mg total) by mouth 4 (four) times daily. 07/15/14   Tiffany Neva Seat, PA-C  metroNIDAZOLE (FLAGYL) 500 MG tablet Take 1 tablet (500 mg total) by mouth 2 (two) times daily. One po bid x 7 days 06/29/14   Purvis Sheffield, MD  ranitidine (ZANTAC) 150 MG capsule Take 1 capsule (150 mg total) by mouth daily. 06/29/14   Purvis Sheffield, MD   BP 118/70 mmHg  Pulse 89  Temp(Src) 97.8 F (36.6 C) (Oral)  Resp 16  SpO2 99%  LMP 06/24/2014 Physical Exam  Constitutional: She appears well-developed and well-nourished. No distress.  HENT:  Head: Normocephalic and atraumatic.  Eyes: Conjunctivae are normal.  Neck: Neck supple.  Cardiovascular: Normal rate, regular rhythm and intact distal pulses.   Pulmonary/Chest: Effort normal and breath sounds normal. No respiratory distress. She has no wheezes. She has no rhonchi. She has no rales. She exhibits tenderness.    Abdominal: Soft. There is no tenderness.  Musculoskeletal: She exhibits no tenderness.  Lymphadenopathy:    She has no cervical adenopathy.  Neurological: She is alert.  Skin: Skin is warm and dry. No rash  noted. She is not diaphoretic.  Psychiatric: She has a normal mood and affect.  Nursing note and vitals reviewed.   ED Course  Procedures (including critical care time) Labs Review Labs Reviewed  BASIC METABOLIC PANEL - Abnormal; Notable for the following:    CO2 21 (*)    BUN 5 (*)    All other components within normal limits  CBC  I-STAT TROPOININ, ED  POC URINE PREG, ED  POC URINE PREG, ED    Imaging Review Dg Chest 2  View  07/31/2014   CLINICAL DATA:  Acute onset of sharp mid chest pain and back pain. Shortness of breath and diaphoresis. Initial encounter.  EXAM: CHEST  2 VIEW  COMPARISON:  Chest radiograph performed 01/31/2013  FINDINGS: The lungs are well-aerated and clear. There is no evidence of focal opacification, pleural effusion or pneumothorax.  The heart is normal in size; the mediastinal contour is within normal limits. No acute osseous abnormalities are seen.  IMPRESSION: No acute cardiopulmonary process seen.   Electronically Signed   By: Roanna RaiderJeffery  Chang M.D.   On: 07/31/2014 00:35     EKG Interpretation   Date/Time:  Friday Jul 30 2014 22:30:52 EDT Ventricular Rate:  77 PR Interval:  124 QRS Duration: 84 QT Interval:  376 QTC Calculation: 425 R Axis:   92 Text Interpretation:  Normal sinus rhythm Rightward axis Borderline ECG no  prior for comparison Confirmed by HORTON  MD, COURTNEY ([id number]) on  07/31/2014 12:56:40 AM      MDM   Final diagnoses:  Atypical chest pain   24 yo with reproducible chest wall pain. Chest pain is not likely of cardiac or pulmonary etiology d/t presentation, perc negative, VSS, no tracheal deviation, no JVD or new murmur, RRR, breath sounds equal bilaterally, EKG without acute abnormalities, negative troponin, and negative CXR.  Pt has been advised start a PPI and NSAIDs. Case has been discussed with and seen by Dr. Wilkie AyeHorton who agrees with the above plan to discharge. Pt is well-appearing, in no acute distress and vital signs reviewed and not concerning. She appears safe to be discharged.  Discharge include follow-up with their PCP.  Return precautions provided. Pt aware of plan and in agreement.  Filed Vitals:   07/30/14 2229 07/31/14 0132  BP: 118/70   Pulse: 89   Temp: 97.8 F (36.6 C) 98.3 F (36.8 C)  TempSrc: Oral Oral  Resp: 16   SpO2: 99%    Meds given in ED:  Medications  naproxen (NAPROSYN) tablet 500 mg (500 mg Oral Given 07/31/14 0132)  gi  cocktail (Maalox,Lidocaine,Donnatal) (30 mLs Oral Given 07/31/14 0132)    Discharge Medication List as of 07/31/2014  1:26 AM    START taking these medications   Details  naproxen (NAPROSYN) 500 MG tablet Take 1 tablet (500 mg total) by mouth 2 (two) times daily., Starting 07/31/2014, Until Discontinued, Print    omeprazole (PRILOSEC) 20 MG capsule Take 1 capsule (20 mg total) by mouth daily., Starting 07/31/2014, Until Discontinued, Print           Harle BattiestElizabeth Riely Baskett, NP 07/31/14 1631  Shon Batonourtney F Horton, MD 07/31/14 [phone]

## 2014-12-27 ENCOUNTER — Encounter (HOSPITAL_COMMUNITY): Payer: Self-pay | Admitting: *Deleted

## 2014-12-27 ENCOUNTER — Inpatient Hospital Stay (HOSPITAL_COMMUNITY): Payer: 59

## 2014-12-27 ENCOUNTER — Inpatient Hospital Stay (HOSPITAL_COMMUNITY)
Admission: AD | Admit: 2014-12-27 | Discharge: 2014-12-27 | Disposition: A | Discharge status: Home / Self Care | Payer: 59 | Source: Ambulatory Visit | Attending: Family Medicine | Admitting: Family Medicine

## 2014-12-27 DIAGNOSIS — O26899 Other specified pregnancy related conditions, unspecified trimester: Secondary | ICD-10-CM

## 2014-12-27 DIAGNOSIS — R112 Nausea with vomiting, unspecified: Secondary | ICD-10-CM | POA: Diagnosis present

## 2014-12-27 DIAGNOSIS — E860 Dehydration: Secondary | ICD-10-CM | POA: Diagnosis not present

## 2014-12-27 DIAGNOSIS — O219 Vomiting of pregnancy, unspecified: Secondary | ICD-10-CM | POA: Insufficient documentation

## 2014-12-27 DIAGNOSIS — Z3A01 Less than 8 weeks gestation of pregnancy: Secondary | ICD-10-CM | POA: Diagnosis not present

## 2014-12-27 DIAGNOSIS — R109 Unspecified abdominal pain: Secondary | ICD-10-CM | POA: Diagnosis not present

## 2014-12-27 HISTORY — DX: Other specified health status: Z78.9

## 2014-12-27 LAB — URINALYSIS, ROUTINE W REFLEX MICROSCOPIC
BILIRUBIN URINE: NEGATIVE
Glucose, UA: NEGATIVE mg/dL
Ketones, ur: >80 mg/dL — AB
Leukocytes, UA: NEGATIVE
Nitrite: NEGATIVE
Protein, ur: NEGATIVE mg/dL
Specific Gravity, Urine: 1.02 (ref 1.005–1.030)
Urobilinogen, UA: 0.2 mg/dL (ref 0.0–1.0)
pH: 5.5 (ref 5.0–8.0)

## 2014-12-27 LAB — CBC
HEMATOCRIT: 39 % (ref 36.0–46.0)
Hemoglobin: 13.2 g/dL (ref 12.0–15.0)
MCH: 30.6 pg (ref 26.0–34.0)
MCHC: 33.8 g/dL (ref 30.0–36.0)
MCV: 90.3 fL (ref 78.0–100.0)
Platelets: 254 10*3/uL (ref 150–400)
RBC: 4.32 MIL/uL (ref 3.87–5.11)
RDW: 13.1 % (ref 11.5–15.5)
WBC: 10.2 10*3/uL (ref 4.0–10.5)

## 2014-12-27 LAB — URINE MICROSCOPIC-ADD ON

## 2014-12-27 LAB — WET PREP, GENITAL
Clue Cells Wet Prep HPF POC: NONE SEEN
Trich, Wet Prep: NONE SEEN
YEAST WET PREP: NONE SEEN

## 2014-12-27 LAB — ABO/RH: ABO/RH(D): AB POS

## 2014-12-27 LAB — HCG, QUANTITATIVE, PREGNANCY: hCG, Beta Chain, Quant, S: 14730 m[IU]/mL — ABNORMAL HIGH

## 2014-12-27 LAB — POCT PREGNANCY, URINE: PREG TEST UR: POSITIVE — AB

## 2014-12-27 MED ORDER — FAMOTIDINE IN NACL 20-0.9 MG/50ML-% IV SOLN
20.0000 mg | Freq: Once | INTRAVENOUS | Status: AC
  Administered 2014-12-27: 20 mg via INTRAVENOUS
  Filled 2014-12-27: qty 50

## 2014-12-27 MED ORDER — DEXTROSE 5 % IN LACTATED RINGERS IV BOLUS
1000.0000 mL | Freq: Once | INTRAVENOUS | Status: AC
  Administered 2014-12-27: 1000 mL via INTRAVENOUS

## 2014-12-27 MED ORDER — LACTATED RINGERS IV BOLUS (SEPSIS)
1000.0000 mL | Freq: Once | INTRAVENOUS | Status: AC
  Administered 2014-12-27: 1000 mL via INTRAVENOUS

## 2014-12-27 MED ORDER — PROMETHAZINE HCL 25 MG/ML IJ SOLN
12.5000 mg | Freq: Once | INTRAMUSCULAR | Status: AC
  Administered 2014-12-27: 12.5 mg via INTRAVENOUS
  Filled 2014-12-27: qty 1

## 2014-12-27 MED ORDER — PROMETHAZINE HCL 12.5 MG PO TABS: 12.5000 mg | ORAL_TABLET | Freq: Four times a day (QID) | ORAL | Status: DC | PRN

## 2014-12-27 NOTE — MAU Provider Note (Signed)
History     CSN: [id number]  Arrival date and time: 12/27/14 1607   First Provider Initiated Contact with Patient 12/27/14 1717      Chief Complaint  Patient presents with  . Nausea  . Emesis  . Possible Pregnancy   HPI   Ms.Linda Wallace is a 24 y.o. female G1P0 at [redacted]w[redacted]d presenting to MAU with nausea and vomiting. She has vomited around 11 times today.  This is not a desired, not a planned pregnancy. Patient is unsure what she plans to do with the pregnancy.   She denies vaginal bleeding + abdominal pain; lower abdominal pain that is sharp at time.  The pain comes and goes. She currently rates her pain 5/10. She has not taken anything for pain.   OB History    Gravida Para Term Preterm AB TAB SAB Ectopic Multiple Living   1               Past Medical History  Diagnosis Date  . Medical history non-contributory     Past Surgical History  Procedure Laterality Date  . No past surgeries      Family History  Problem Relation Age of Onset  . Hypertension Other   . Diabetes Other   . Crohn's disease Mother   . Cancer Paternal Uncle     Social History  Substance Use Topics  . Smoking status: Former Smoker -- 0.50 packs/day    Types: Cigarettes    Quit date: 08/18/2013  . Smokeless tobacco: None  . Alcohol Use: Yes     Comment: occ    Allergies:  Allergies  Allergen Reactions  . Food     PATIENT IS ALLERGIC TO ALL FRUIT---HIVES/TONGUE SWELL/THROAT CLOSES    Prescriptions prior to admission  Medication Sig Dispense Refill Last Dose  . cephALEXin (KEFLEX) 500 MG capsule Take 1 capsule (500 mg total) by mouth 4 (four) times daily. (Patient not taking: Reported on 07/31/2014) 28 capsule 0 Completed Course at Unknown time  . metroNIDAZOLE (FLAGYL) 500 MG tablet Take 1 tablet (500 mg total) by mouth 2 (two) times daily. One po bid x 7 days (Patient not taking: Reported on 07/31/2014) 14 tablet 0 Completed Course at Unknown time  . naproxen (NAPROSYN) 500 MG tablet  Take 1 tablet (500 mg total) by mouth 2 (two) times daily. (Patient not taking: Reported on 12/27/2014) 30 tablet 0   . omeprazole (PRILOSEC) 20 MG capsule Take 1 capsule (20 mg total) by mouth daily. (Patient not taking: Reported on 12/27/2014) 30 capsule 0   . ranitidine (ZANTAC) 150 MG capsule Take 1 capsule (150 mg total) by mouth daily. (Patient not taking: Reported on 07/31/2014) 30 capsule 0 Not Taking at Unknown time   Results for orders placed or performed during the hospital encounter of 12/27/14 (from the past 24 hour(s))  Urinalysis, Routine w reflex microscopic (not at [facility])     Status: Abnormal   Collection Time: 12/27/14  4:15 PM  Result Value Ref Range   Color, Urine YELLOW YELLOW   APPearance CLEAR CLEAR   Specific Gravity, Urine 1.020 1.005 - 1.030   pH 5.5 5.0 - 8.0   Glucose, UA NEGATIVE NEGATIVE mg/dL   Hgb urine dipstick TRACE (A) NEGATIVE   Bilirubin Urine NEGATIVE NEGATIVE   Ketones, ur >80 (A) NEGATIVE mg/dL   Protein, ur NEGATIVE NEGATIVE mg/dL   Urobilinogen, UA 0.2 0.0 - 1.0 mg/dL   Nitrite NEGATIVE NEGATIVE   Leukocytes, UA NEGATIVE NEGATIVE  Urine  microscopic-add on     Status: Abnormal   Collection Time: 12/27/14  4:15 PM  Result Value Ref Range   Squamous Epithelial / LPF FEW (A) RARE   WBC, UA 0-2 <3 WBC/hpf   RBC / HPF 0-2 <3 RBC/hpf   Bacteria, UA RARE RARE  Pregnancy, urine POC     Status: Abnormal   Collection Time: 12/27/14  4:22 PM  Result Value Ref Range   Preg Test, Ur POSITIVE (A) NEGATIVE  CBC     Status: None   Collection Time: 12/27/14  5:50 PM  Result Value Ref Range   WBC 10.2 4.0 - 10.5 K/uL   RBC 4.32 3.87 - 5.11 MIL/uL   Hemoglobin 13.2 12.0 - 15.0 g/dL   HCT 56.2 13.0 - 86.5 %   MCV 90.3 78.0 - 100.0 fL   MCH 30.6 26.0 - 34.0 pg   MCHC 33.8 30.0 - 36.0 g/dL   RDW 78.4 69.6 - 29.5 %   Platelets 254 150 - 400 K/uL  ABO/Rh     Status: None   Collection Time: 12/27/14  5:50 PM  Result Value Ref Range   ABO/RH(D) AB POS    hCG, quantitative, pregnancy     Status: Abnormal   Collection Time: 12/27/14  5:50 PM  Result Value Ref Range   hCG, Beta Chain, Quant, S 14730 (H) <5 mIU/mL  Wet prep, genital     Status: Abnormal   Collection Time: 12/27/14  7:30 PM  Result Value Ref Range   Yeast Wet Prep HPF POC NONE SEEN NONE SEEN   Trich, Wet Prep NONE SEEN NONE SEEN   Clue Cells Wet Prep HPF POC NONE SEEN NONE SEEN   WBC, Wet Prep HPF POC FEW (A) NONE SEEN   US Ob Comp Less 14 Wks  12/27/2014  CLINICAL DATA:  Pelvic cramping EXAM: OBSTETRIC <14 WK [place] AND TRANSVAGINAL OB US TECHNIQUE: Both transabdominal and transvaginal ultrasound examinations were performed for complete evaluation of the gestation as well as the maternal uterus, adnexal regions, and pelvic cul-de-sac. Transvaginal technique was performed to assess early pregnancy. COMPARISON:  None. FINDINGS: Intrauterine gestational sac: Visualized/normal in shape. Yolk sac:  Present Embryo:  Present Cardiac Activity: Present Heart Rate: 120  bpm CRL:  5.5  mm   6 w   2 d                  [place] EDC: 08/20/2014 Maternal uterus/adnexae: Within normal limits no free fluid is seen. IMPRESSION: Single live intrauterine gestation at 6 weeks 2 days Electronically Signed   By: Alcide Clever M.D.   On: 12/27/2014 18:52   US Ob Transvaginal  12/27/2014  CLINICAL DATA:  Pelvic cramping EXAM: OBSTETRIC <14 WK [place] AND TRANSVAGINAL OB US TECHNIQUE: Both transabdominal and transvaginal ultrasound examinations were performed for complete evaluation of the gestation as well as the maternal uterus, adnexal regions, and pelvic cul-de-sac. Transvaginal technique was performed to assess early pregnancy. COMPARISON:  None. FINDINGS: Intrauterine gestational sac: Visualized/normal in shape. Yolk sac:  Present Embryo:  Present Cardiac Activity: Present Heart Rate: 120  bpm CRL:  5.5  mm   6 w   2 d                  [place] EDC: 08/20/2014 Maternal uterus/adnexae: Within normal limits no free fluid is  seen. IMPRESSION: Single live intrauterine gestation at 6 weeks 2 days Electronically Signed   By: Eulah Pont.D.  On: 12/27/2014 18:52   Review of Systems  Constitutional: Negative for fever.  Gastrointestinal: Positive for heartburn, nausea, vomiting and abdominal pain. Negative for diarrhea and constipation.  Genitourinary: Negative for dysuria.  Neurological: Positive for dizziness.   Physical Exam   Blood pressure 113/62, pulse 113, temperature 98.4 F (36.9 C), temperature source Oral, resp. rate 16, height 5\' 5"  (1.651 m), weight 111 lb (50.349 kg), last menstrual period 10/23/2014.  Physical Exam  Constitutional: She is oriented to person, place, and time. She appears well-developed and well-nourished. No distress.  HENT:  Head: Normocephalic.  Eyes: Pupils are equal, round, and reactive to light.  Neck: Neck supple.  Respiratory: Effort normal.  GI: Soft. She exhibits no distension and no mass. There is no tenderness. There is no rebound and no guarding.  Genitourinary:  Speculum exam: Vagina - Small amount of creamy discharge, no odor Cervix - No contact bleeding Bimanual exam: Cervix closed Uterus non tender, slightly enlarged  Adnexa non tender, no masses bilaterally GC/Chlam, wet prep done Chaperone present for exam.  Musculoskeletal: Normal range of motion.  Neurological: She is alert and oriented to person, place, and time.  Skin: Skin is warm. She is not diaphoretic. There is pallor.  Psychiatric: Her behavior is normal.    MAU Course  Procedures  None  MDM  Urine shows >80 ketones LR bolus D5LR bolus  Phenergan 12.5 IM Pepcid 20 mg IV  Patient tolerating PO fluids without difficulty.   Assessment and Plan   A:  1. Nausea and vomiting in pregnancy   2. Abdominal pain in pregnancy, antepartum   3. Dehydration     P:  Discharge home in stable condition RX: Phenergan  Ok to take over the counter pepcid, or zantac as needed Return to  MAU if symptoms worsen Small, frequent meals   Duane LopeJennifer I Francee Setzer, NP 12/27/2014 8:16 PM

## 2014-12-27 NOTE — MAU Note (Addendum)
Positive UPT today; unsure of LNMP; giggling with her boyfriend- does not seem uncomfort or nauseated;

## 2014-12-27 NOTE — MAU Note (Signed)
C/o N&V for about week; missed 2 periods; c/o abdominal cramping for about a month;

## 2014-12-28 LAB — GC/CHLAMYDIA PROBE AMP (~~LOC~~) NOT AT ARMC
CHLAMYDIA, DNA PROBE: NEGATIVE
Neisseria Gonorrhea: NEGATIVE

## 2014-12-28 LAB — HIV ANTIBODY (ROUTINE TESTING W REFLEX): HIV Screen 4th Generation wRfx: NONREACTIVE

## 2015-03-09 ENCOUNTER — Emergency Department (HOSPITAL_COMMUNITY)
Admission: EM | Admit: 2015-03-09 | Discharge: 2015-03-09 | Disposition: A | Discharge status: Home / Self Care | Payer: 59 | Attending: Emergency Medicine | Admitting: Emergency Medicine

## 2015-03-09 ENCOUNTER — Encounter (HOSPITAL_COMMUNITY): Payer: Self-pay | Admitting: Emergency Medicine

## 2015-03-09 DIAGNOSIS — R1012 Left upper quadrant pain: Secondary | ICD-10-CM | POA: Insufficient documentation

## 2015-03-09 DIAGNOSIS — E860 Dehydration: Secondary | ICD-10-CM | POA: Insufficient documentation

## 2015-03-09 DIAGNOSIS — R195 Other fecal abnormalities: Secondary | ICD-10-CM | POA: Diagnosis not present

## 2015-03-09 DIAGNOSIS — N939 Abnormal uterine and vaginal bleeding, unspecified: Secondary | ICD-10-CM

## 2015-03-09 DIAGNOSIS — N938 Other specified abnormal uterine and vaginal bleeding: Secondary | ICD-10-CM | POA: Insufficient documentation

## 2015-03-09 DIAGNOSIS — R197 Diarrhea, unspecified: Secondary | ICD-10-CM | POA: Insufficient documentation

## 2015-03-09 DIAGNOSIS — R112 Nausea with vomiting, unspecified: Secondary | ICD-10-CM | POA: Insufficient documentation

## 2015-03-09 DIAGNOSIS — Z3202 Encounter for pregnancy test, result negative: Secondary | ICD-10-CM | POA: Insufficient documentation

## 2015-03-09 DIAGNOSIS — R000 Tachycardia, unspecified: Secondary | ICD-10-CM | POA: Diagnosis not present

## 2015-03-09 DIAGNOSIS — Z87891 Personal history of nicotine dependence: Secondary | ICD-10-CM | POA: Insufficient documentation

## 2015-03-09 DIAGNOSIS — R1013 Epigastric pain: Secondary | ICD-10-CM | POA: Insufficient documentation

## 2015-03-09 LAB — COMPREHENSIVE METABOLIC PANEL
ALT: 17 U/L (ref 14–54)
ANION GAP: 19 — AB (ref 5–15)
AST: 25 U/L (ref 15–41)
Albumin: 5.1 g/dL — ABNORMAL HIGH (ref 3.5–5.0)
Alkaline Phosphatase: 49 U/L (ref 38–126)
BILIRUBIN TOTAL: 0.4 mg/dL (ref 0.3–1.2)
BUN: 10 mg/dL (ref 6–20)
CO2: 18 mmol/L — ABNORMAL LOW (ref 22–32)
Calcium: 9.9 mg/dL (ref 8.9–10.3)
Chloride: 108 mmol/L (ref 101–111)
Creatinine, Ser: 0.95 mg/dL (ref 0.44–1.00)
Glucose, Bld: 88 mg/dL (ref 65–99)
POTASSIUM: 3.8 mmol/L (ref 3.5–5.1)
Sodium: 145 mmol/L (ref 135–145)
TOTAL PROTEIN: 8.4 g/dL — AB (ref 6.5–8.1)

## 2015-03-09 LAB — URINE MICROSCOPIC-ADD ON

## 2015-03-09 LAB — URINALYSIS, ROUTINE W REFLEX MICROSCOPIC
Glucose, UA: NEGATIVE mg/dL
LEUKOCYTES UA: NEGATIVE
NITRITE: NEGATIVE
PH: 5.5 (ref 5.0–8.0)
PROTEIN: 100 mg/dL — AB
Specific Gravity, Urine: 1.027 (ref 1.005–1.030)

## 2015-03-09 LAB — LIPASE, BLOOD: LIPASE: 28 U/L (ref 11–51)

## 2015-03-09 LAB — CBC
HEMATOCRIT: 44.6 % (ref 36.0–46.0)
Hemoglobin: 15.3 g/dL — ABNORMAL HIGH (ref 12.0–15.0)
MCH: 30.6 pg (ref 26.0–34.0)
MCHC: 34.3 g/dL (ref 30.0–36.0)
MCV: 89.2 fL (ref 78.0–100.0)
Platelets: 264 10*3/uL (ref 150–400)
RBC: 5 MIL/uL (ref 3.87–5.11)
RDW: 12.7 % (ref 11.5–15.5)
WBC: 15.4 10*3/uL — AB (ref 4.0–10.5)

## 2015-03-09 LAB — I-STAT BETA HCG BLOOD, ED (MC, WL, AP ONLY): I-stat hCG, quantitative: <5 m[IU]/mL (ref <5)

## 2015-03-09 MED ORDER — ONDANSETRON 4 MG PO TBDP: 4.0000 mg | ORAL_TABLET | Freq: Three times a day (TID) | ORAL | Status: DC | PRN

## 2015-03-09 MED ORDER — DICYCLOMINE HCL 20 MG PO TABS: 20.0000 mg | ORAL_TABLET | Freq: Two times a day (BID) | ORAL | Status: DC

## 2015-03-09 MED ORDER — SODIUM CHLORIDE 0.9 % IV BOLUS (SEPSIS)
1000.0000 mL | Freq: Once | INTRAVENOUS | Status: AC
  Administered 2015-03-09: 1000 mL via INTRAVENOUS

## 2015-03-09 MED ORDER — DICYCLOMINE HCL 10 MG PO CAPS
10.0000 mg | ORAL_CAPSULE | Freq: Once | ORAL | Status: AC
  Administered 2015-03-09: 10 mg via ORAL
  Filled 2015-03-09: qty 1

## 2015-03-09 MED ORDER — ONDANSETRON HCL 4 MG/2ML IJ SOLN
4.0000 mg | Freq: Once | INTRAMUSCULAR | Status: AC
  Administered 2015-03-09: 4 mg via INTRAVENOUS
  Filled 2015-03-09: qty 2

## 2015-03-09 NOTE — ED Notes (Signed)
Awake. Verbally responsive. A/O x4. Resp even and unlabored. No audible adventitious breath sounds noted. ABC's intact. IV infusing NS at 999ml/hr without difficulty. 

## 2015-03-09 NOTE — ED Provider Notes (Signed)
CSN: [id number]     Arrival date & time 03/09/15  0539 History   First MD Initiated Contact with Patient 03/09/15 [phone]     Chief Complaint  Patient presents with  . Abdominal Pain  . Nausea  . Emesis     (Consider location/radiation/quality/duration/timing/severity/associated sxs/prior Treatment) HPI   Linda Wallace is a 24 y.o. female, with a history of elective abortion, presenting to the ED with persistent nausea, vomiting, and diarrhea for the past 8 hours accompanied by abdominal pain. Rates the pain at 5/10, sharp, located in LUQ, non-radiating. Pt states that her boyfriend had exactly the same symptoms as her on 12/24. Pt denies urinary complaints, constipation, fever/chills, chest pain, shortness of breath, or any other pain or complaints. Pt states she hasn't taken anything for the pain or other complaints.  Pt has some intermittent vaginal bleeding, "like I'm having my period," that has been going on since November 3 when she had an elective abortion. Pt endorses using about 1 pad an hour, the bleeding lasts for a few days at a time and has occurred at least three times since Nov 3. Pt adds that she has not had a regular menstrual period since this time. LMP was Aug 18. Pt was prescribed oral contraceptives, but hasn't yet started taking them.    Past Medical History  Diagnosis Date  . Medical history non-contributory    Past Surgical History  Procedure Laterality Date  . No past surgeries     Family History  Problem Relation Age of Onset  . Hypertension Other   . Diabetes Other   . Crohn's disease Mother   . Cancer Paternal Uncle    Social History  Substance Use Topics  . Smoking status: Former Smoker -- 0.50 packs/day    Types: Cigarettes    Quit date: 08/18/2013  . Smokeless tobacco: None  . Alcohol Use: Yes     Comment: occ   OB History    Gravida Para Term Preterm AB TAB SAB Ectopic Multiple Living   1              Review of Systems  Gastrointestinal:  Positive for nausea, vomiting, abdominal pain, diarrhea and blood in stool.  Genitourinary: Positive for vaginal bleeding. Negative for dysuria, hematuria, flank pain, vaginal discharge and vaginal pain.  Skin: Negative for color change and pallor.  Neurological: Negative for dizziness, weakness and light-headedness.  All other systems reviewed and are negative.     Allergies  Food  Home Medications   Prior to Admission medications   Medication Sig Start Date End Date Taking? Authorizing Provider  dicyclomine (BENTYL) 20 MG tablet Take 1 tablet (20 mg total) by mouth 2 (two) times daily. 03/09/15   Chanita Boden C Bodhi Stenglein, PA-C  ondansetron (ZOFRAN ODT) 4 MG disintegrating tablet Take 1 tablet (4 mg total) by mouth every 8 (eight) hours as needed for nausea or vomiting. 03/09/15   Deantae Shackleton C Rodriguez Aguinaldo, PA-C  promethazine (PHENERGAN) 12.5 MG tablet Take 1 tablet (12.5 mg total) by mouth every 6 (six) hours as needed for nausea or vomiting. Patient not taking: Reported on 03/09/2015 12/27/14   Duane Lope, NP   BP 100/59 mmHg  Pulse 60  Temp(Src) 98.4 F (36.9 C) (Oral)  Resp 18  Ht  (1.676 m)  Wt 50.803 kg  BMI 18.09 kg/m2  SpO2 99%  LMP 10/23/2014 Physical Exam  Constitutional: She appears well-developed and well-nourished. No distress.  HENT:  Head: Normocephalic and atraumatic.  Eyes: Conjunctivae are normal. Pupils are equal, round, and reactive to light.  Cardiovascular: Normal rate, regular rhythm and normal heart sounds.   Pulmonary/Chest: Effort normal and breath sounds normal. No respiratory distress.  Abdominal: Soft. Normal appearance and bowel sounds are normal. There is tenderness in the epigastric area and left upper quadrant. There is no CVA tenderness.  Musculoskeletal: She exhibits no edema or tenderness.  Neurological: She is alert.  Skin: Skin is warm and dry. She is not diaphoretic.  Nursing note and vitals reviewed.   ED Course  Procedures (including critical  care time) Labs Review Labs Reviewed  COMPREHENSIVE METABOLIC PANEL - Abnormal; Notable for the following:    CO2 18 (*)    Total Protein 8.4 (*)    Albumin 5.1 (*)    Anion gap 19 (*)    All other components within normal limits  CBC - Abnormal; Notable for the following:    WBC 15.4 (*)    Hemoglobin 15.3 (*)    All other components within normal limits  URINALYSIS, ROUTINE W REFLEX MICROSCOPIC (NOT [facility]) - Abnormal; Notable for the following:    Color, Urine AMBER (*)    APPearance CLOUDY (*)    Hgb urine dipstick TRACE (*)    Bilirubin Urine SMALL (*)    Ketones, ur >80 (*)    Protein, ur 100 (*)    All other components within normal limits  URINE MICROSCOPIC-ADD ON - Abnormal; Notable for the following:    Squamous Epithelial / LPF 0-5 (*)    Bacteria, UA FEW (*)    All other components within normal limits  LIPASE, BLOOD  I-STAT BETA HCG BLOOD, ED ([facility], [facility], [facility] ONLY)    Imaging Review No results found. I have personally reviewed and evaluated these lab results as part of my medical decision-making.   EKG Interpretation None      Medications  ondansetron (ZOFRAN) injection 4 mg (4 mg Intravenous Given 03/09/15 0826)  sodium chloride 0.9 % bolus 1,000 mL (0 mLs Intravenous Stopped 03/09/15 0845)  dicyclomine (BENTYL) capsule 10 mg (10 mg Oral Given 03/09/15 0826)  sodium chloride 0.9 % bolus 1,000 mL (0 mLs Intravenous Stopped 03/09/15 1014)  sodium chloride 0.9 % bolus 1,000 mL (0 mLs Intravenous Stopped 03/09/15 1230)     MDM   Final diagnoses:  Left upper quadrant pain  Non-intractable vomiting with nausea, vomiting of unspecified type  Diarrhea, unspecified type  Vaginal bleeding  Dehydration    Linda Wallace presents with abdominal pain, nausea, vomiting, and diarrhea for the past 8 hours. Patient also endorses intermittent vaginal bleeding for the past 2 months.  Findings and plan of care discussed with Laurence Spatesachel Morgan Little, MD.  Patient's  presentation is most likely due to viral illness, especially with a history of sick contacts with the same symptoms. Pt is also showing signs of dehydration. Patient is nontoxic appearing, afebrile, is normotensive, has a rather benign abdominal exam, and is in no apparent distress. Patient's initial tachycardia came down to a normal rate on recheck. Vaginal bleeding is likely not related to the patient's other symptoms.  9:07 AM Pt states she feels much better, her pain and nausea are both gone. Pt denies any additional complaints. Pt has no signs of severe infection, pelvic infection or inflammatory process, or significant blood loss. Patient advised to follow-up with the clinic that performed the abortion for assessment of the vaginal bleeding. Patient was given home care instructions as well as return  precautions. Patient voiced understanding of these instructions, accepts the plan, and is comfortable with discharge.  Filed Vitals:   03/09/15 1130 03/09/15 1144 03/09/15 1200 03/09/15 1232  BP: 114/66 114/66 89/54 100/59  Pulse: 117 71 72 60  Temp:  98.4 F (36.9 C)    TempSrc:  Oral    Resp:  14  18  Height:      Weight:      SpO2: 96% 98% 98% 99%       Anselm Pancoast, PA-C 03/09/15 1634  Laurence Spates, MD 03/11/15 [phone]

## 2015-03-09 NOTE — ED Notes (Signed)
Pt reported having abd pain, n/v x more than 20 diarrhea x10 in past 24hr. Pt actively vomiting. Pt  Reported that her boyfriend was with same symptoms on 12/25.

## 2015-03-09 NOTE — ED Notes (Signed)
Awake. Verbally responsive. A/O x4. Resp even and unlabored. No audible adventitious breath sounds noted. ABC's intact. IV infusing NS at 93999ml/hr without difficulty. Pt denies n/v/d at this time.

## 2015-03-09 NOTE — ED Notes (Signed)
Patient here with complaints of abd pain, nausea, vomiting for 7 hours. "I've thrown up 100 times". Denies fever.

## 2015-03-09 NOTE — Discharge Instructions (Signed)
You have been seen today for abdominal pain, nausea, and diarrhea. Your lab tests showed no abnormalities. Follow-up with the clinic that performed your procedure for the vaginal bleeding. Your other symptoms are most likely due to a viral illness. The treatment for a viral illness is supportive care. Follow up with PCP as needed. Return to ED should symptoms worsen. Drink plenty of fluids and get plenty of rest.   Emergency Department Resource Guide 1) Find a Doctor and Pay Out of Pocket Although you won't have to find out who is covered by your insurance plan, it is a good idea to ask around and get recommendations. You will then need to call the office and see if the doctor you have chosen will accept you as a new patient and what types of options they offer for patients who are self-pay. Some doctors offer discounts or will set up payment plans for their patients who do not have insurance, but you will need to ask so you aren't surprised when you get to your appointment.  2) Contact Your Local Health Department Not all health departments have doctors that can see patients for sick visits, but many do, so it is worth a call to see if yours does. If you don't know where your local health department is, you can check in your phone book. The CDC also has a tool to help you locate your state's health department, and many state websites also have listings of all of their local health departments.  3) Find a Walk-in Clinic If your illness is not likely to be very severe or complicated, you may want to try a walk in clinic. These are popping up all over the country in pharmacies, drugstores, and shopping centers. They're usually staffed by nurse practitioners or physician assistants that have been trained to treat common illnesses and complaints. They're usually fairly quick and inexpensive. However, if you have serious medical issues or chronic medical problems, these are probably not your best option.  No  Primary Care Doctor: - Call Health Connect at  [phone] - they can help you locate a primary care doctor that  accepts your insurance, provides certain services, etc. - Physician Referral Service- [phone]  Chronic Pain Problems: Organization         Address  Phone   Notes  [facility]  [phone] Patients need to be referred by their primary care doctor.   Medication Assistance: Organization         Address  Phone   Notes  [facility] [address] [place], [place] [address] [phone] --Must be a resident of [place] [facility] -- Must have NO insurance coverage whatsoever (no Medicaid/ Medicare, etc.) -- The pt. MUST have a primary care doctor that directs their care regularly and follows them in the community   MedAssist  [phone]   [employer]  [phone]    Agencies that provide inexpensive medical care: Organization         Address  Phone   Notes  [facility]  [phone]   [facility]    [phone]   [facility] [address] [place], [place] [address] [phone]   [facility] [address], [place] [phone]   [facility]anned Parenthood    [phone]   [facility]    [phone]   [facility]  [address], [place] Phone:  [phone], Fax:  [phone] Hours of Operation:  9 am - 6 pm, M-F.  Also accepts Medicaid/Medicare and self-pay.  [facility] for [place], [address], [place] Phone: [phone], Fax: [phone]. Hours of Operation:  8:30 am - 5:30 pm, M-F.  Also accepts Medicaid and self-pay.  [facility] [address], [place] Phone: [phone]   [place], [place], [place] [phone], Ext. [phone] Mondays & Thursdays: 7-9 AM.  First 15 patients are seen on  a first come, first serve basis.    [place] Providers:  Organization         Address  Phone   Notes  [facility] [address], [place] [phone] Also accepts self-pay patients.  [facility] [address] [place] [place], [place]  [phone]   [place], [address], [place] [phone]   [facility] [address] [place] [phone]   Lucianne Lei [address], [place]   [phone] Only accepts [place] Access [place] patients after they have their name applied to their card.   Self-Pay (no insurance) in [facility]:  Organization         Address  Phone   Notes  Sickle Cell Patients, [facility] [place] [phone]   [facility] [place] [phone]   [facility] [place]  [place], [place], [place] [phone]   [facility]/Dr. Osei-Bonsu  [address], [place] or [address], [place] [phone] Phone number for both [place] and [place] locations is the same.  Urgent Medical and [facility] [address] [place] [phone]   [facility] [address], [place] or [address] [phone]   [facility] [address], [place] [phone], phone; [phone], fax Sees patients 1st and 3rd Saturday of every month.  Must not qualify for public or private insurance (i.e. Medicaid, Medicare, [place] Health Choice, Veterans' Benefits)  Household income should be no more than 200% of the poverty level The clinic cannot treat you if you are pregnant or think you are pregnant  Sexually transmitted diseases are not treated at the clinic.    Dental Care: Organization          Address  Phone  Notes  [facility] Department of [facility] [phone] Accepts children up to age 21 who are enrolled in [place] or [place]; pregnant women with a Medicaid card; and children who have applied for Medicaid or [place] Health Choice, but were declined, whose parents can pay a reduced fee at time of service.  [facility] Department of [facility]  [address], [place] [phone] Accepts children up to age 23 who are enrolled in [place] or [place]; pregnant women with a Medicaid card; and children who have applied for Medicaid or [place] Health Choice, but were declined, whose parents can pay a reduced fee at time of service.  [facility]  [address] [place], [place] (  [phone] Patients are seen by appointment only. Walk-ins are not accepted. [place] will see patients 64 years of age and older. Monday - Tuesday (8am-5pm) Most Wednesdays (8:30-5pm) $30 per visit, cash only  [facility] Adult [facility] Access PROGRAM  [address], [facility] [phone] Patients are seen by appointment only. Walk-ins are not accepted. [place] will see patients 78 years of age and older. One Wednesday Evening (Monthly: Volunteer Based).  $30 per visit, cash only  [place]  [phone] for adults; Children under age 61, call Graduate Pediatric Dentistry at [phone]. Children aged 2-14, please call [phone] to request a pediatric application.  Dental services are provided in all areas of dental care including fillings, crowns and bridges, complete and partial dentures, implants, gum treatment, root canals, and extractions. Preventive care is also provided. Treatment is provided to both adults and children. Patients are selected via a lottery and there is often a waiting list.   [facility] [address] [place]  [phone] [email or web address]   [facility] [address] [place], [place] [phone], Ext. [phone] Second and Fourth Thursday of each month, opens at 6:30 AM; Clinic ends at 9 AM.  Patients are seen on a first-come first-served basis, and a limited number are seen during each clinic.   [facility] [address] Hillard Danker [place], [place] [phone]   Eligibility Requirements You must have lived in [place], [place], or [place] counties for at least the last three months.   You cannot be eligible for state or federal sponsored [employer], including [employer], [place], or [employer].   You generally cannot be eligible for healthcare insurance through your employer.    How to apply: Eligibility screenings are held every Tuesday and Wednesday afternoon from 1:00 pm until 4:00 pm. You do not need an appointment for the interview!  [facility] [address], [place], [place]   [place]  [place] Department  [place]  [phone]    Behavioral Health Resources in the Community: Intensive Outpatient Programs Organization         Address  Phone  Notes  [place]. [address], [place], [place] [phone]   [facility] [address] [place], [place]   ADS: [facility] [address], [place], [place]   [place] [address]  [place], [place] or [phone]   Substance Abuse Resources Organization         Address  Phone  Notes  Alcohol and Drug Services  [phone]   [place]  [phone]   [facility] [place]   Chinita Pester  [phone]   Residential & Outpatient Substance Abuse Program  [phone]   Psychological  Services Organization         Address  Phone  Notes  [facility] [place]  [place]  [phone]   [place] [address] [place] or [phone]    Mobile Crisis Teams Organization         Address  Phone  Notes  [facility], Mobile Crisis Care Unit  [phone]   [facility]  [address] [place], [place]   [place] [address] [place] [phone]    Self-Help/Support Groups Organization         Address  Phone             Notes  [facility] of [place] - variety of support groups  [place] Call for more information  Narcotics Anonymous (NA), [facility] [address], [employer] [place]  2 meetings at this location   Special educational needs teacher         Address  Phone  Notes  [facility] [place],    [place]  [phone]   [facility]  [address], [place] [phone], [place], [place]   [place], [place] [phone] Admissions: 8am-3pm M-F  Incentives Substance [place] [address],    [place], [place] [phone]   [facility] [address] [place], [place], [place]   [facility] [place] [address],  [place], [place]   [facility] - Intensive Outpatient [address] Kristeen [address], [place], [place]   [facility].,  [place], [place] [phone] or [phone]   [facility] (RTS) [address], [place], [place] Accepts Medicaid  [facility] [place] [address],  [place] [phone] Substance Abuse/Addiction Treatment   [facility] Organization         Address  Phone  Notes  [facility]  [phone]   Domenic Schwab, PhD [address] Arlis Porta [place], [place]   [phone] or [phone]   [place], [place] [phone]   [facility] [address] [place], [place] [phone] Insurance/Medicaid/sponsorship through [facility] Families [address] [place]                                    [place], [place] [phone] [place] [address], [place] [phone]    Dr. Adele Schilder  [phone]   [facility] Dept. 1) [address] [place] 2) [place] 3)  [place] [address], [place] [phone]  [phone]   [place] [phone] or [phone] (After Hours)

## 2015-03-09 NOTE — ED Notes (Signed)
Pt preferred to drink personal coke product without n/v reported.

## 2015-03-09 NOTE — ED Notes (Signed)
Awake. Verbally responsive. A/O x4. Resp even and unlabored. No audible adventitious breath sounds noted. ABC's intact.  

## 2015-03-24 ENCOUNTER — Encounter (HOSPITAL_BASED_OUTPATIENT_CLINIC_OR_DEPARTMENT_OTHER): Payer: Self-pay | Admitting: *Deleted

## 2015-03-24 ENCOUNTER — Emergency Department (HOSPITAL_BASED_OUTPATIENT_CLINIC_OR_DEPARTMENT_OTHER)
Admission: EM | Admit: 2015-03-24 | Discharge: 2015-03-24 | Disposition: A | Discharge status: Home / Self Care | Payer: Self-pay | Attending: Emergency Medicine | Admitting: Emergency Medicine

## 2015-03-24 DIAGNOSIS — F12288 Cannabis dependence with other cannabis-induced disorder: Secondary | ICD-10-CM | POA: Insufficient documentation

## 2015-03-24 DIAGNOSIS — F1290 Cannabis use, unspecified, uncomplicated: Secondary | ICD-10-CM

## 2015-03-24 DIAGNOSIS — Z3202 Encounter for pregnancy test, result negative: Secondary | ICD-10-CM | POA: Insufficient documentation

## 2015-03-24 DIAGNOSIS — F12988 Cannabis use, unspecified with other cannabis-induced disorder: Secondary | ICD-10-CM

## 2015-03-24 DIAGNOSIS — Z79899 Other long term (current) drug therapy: Secondary | ICD-10-CM | POA: Insufficient documentation

## 2015-03-24 DIAGNOSIS — R1116 Cannabis hyperemesis syndrome: Secondary | ICD-10-CM

## 2015-03-24 DIAGNOSIS — Z87891 Personal history of nicotine dependence: Secondary | ICD-10-CM | POA: Insufficient documentation

## 2015-03-24 LAB — CBC WITH DIFFERENTIAL/PLATELET
BASOS PCT: 0 %
Basophils Absolute: 0 10*3/uL (ref 0.0–0.1)
EOS PCT: 0 %
Eosinophils Absolute: 0 10*3/uL (ref 0.0–0.7)
HCT: 42.7 % (ref 36.0–46.0)
Hemoglobin: 14.4 g/dL (ref 12.0–15.0)
LYMPHS ABS: 0.8 10*3/uL (ref 0.7–4.0)
Lymphocytes Relative: 9 %
MCH: 30.1 pg (ref 26.0–34.0)
MCHC: 33.7 g/dL (ref 30.0–36.0)
MCV: 89.1 fL (ref 78.0–100.0)
MONO ABS: 0.7 10*3/uL (ref 0.1–1.0)
MONOS PCT: 7 %
Neutro Abs: 8 10*3/uL — ABNORMAL HIGH (ref 1.7–7.7)
Neutrophils Relative %: 84 %
PLATELETS: 192 10*3/uL (ref 150–400)
RBC: 4.79 MIL/uL (ref 3.87–5.11)
RDW: 12.5 % (ref 11.5–15.5)
WBC: 9.5 10*3/uL (ref 4.0–10.5)

## 2015-03-24 LAB — URINE MICROSCOPIC-ADD ON

## 2015-03-24 LAB — URINALYSIS, ROUTINE W REFLEX MICROSCOPIC
Glucose, UA: NEGATIVE mg/dL
LEUKOCYTES UA: NEGATIVE
Nitrite: NEGATIVE
PH: 5.5 (ref 5.0–8.0)
PROTEIN: 30 mg/dL — AB
Specific Gravity, Urine: 1.027 (ref 1.005–1.030)

## 2015-03-24 LAB — PREGNANCY, URINE: Preg Test, Ur: NEGATIVE

## 2015-03-24 LAB — COMPREHENSIVE METABOLIC PANEL
ALK PHOS: 43 U/L (ref 38–126)
ALT: 10 U/L — ABNORMAL LOW (ref 14–54)
ANION GAP: 11 (ref 5–15)
AST: 16 U/L (ref 15–41)
Albumin: 4.7 g/dL (ref 3.5–5.0)
BUN: 9 mg/dL (ref 6–20)
CALCIUM: 9.3 mg/dL (ref 8.9–10.3)
CO2: 22 mmol/L (ref 22–32)
Chloride: 107 mmol/L (ref 101–111)
Creatinine, Ser: 0.75 mg/dL (ref 0.44–1.00)
Glucose, Bld: 90 mg/dL (ref 65–99)
Potassium: 3.6 mmol/L (ref 3.5–5.1)
SODIUM: 140 mmol/L (ref 135–145)
Total Bilirubin: 0.6 mg/dL (ref 0.3–1.2)
Total Protein: 8 g/dL (ref 6.5–8.1)

## 2015-03-24 LAB — RAPID URINE DRUG SCREEN, HOSP PERFORMED
Amphetamines: NOT DETECTED
Barbiturates: NOT DETECTED
Benzodiazepines: NOT DETECTED
COCAINE: NOT DETECTED
OPIATES: NOT DETECTED
TETRAHYDROCANNABINOL: POSITIVE — AB

## 2015-03-24 LAB — LIPASE, BLOOD: LIPASE: 25 U/L (ref 11–51)

## 2015-03-24 MED ORDER — PROMETHAZINE HCL 25 MG/ML IJ SOLN
25.0000 mg | INTRAMUSCULAR | Status: DC | PRN
  Administered 2015-03-24: 25 mg via INTRAVENOUS
  Filled 2015-03-24: qty 1

## 2015-03-24 MED ORDER — SODIUM CHLORIDE 0.9 % IV BOLUS (SEPSIS)
1000.0000 mL | Freq: Once | INTRAVENOUS | Status: AC
  Administered 2015-03-24: 1000 mL via INTRAVENOUS

## 2015-03-24 MED ORDER — FENTANYL CITRATE (PF) 100 MCG/2ML IJ SOLN
50.0000 ug | Freq: Once | INTRAMUSCULAR | Status: AC
  Administered 2015-03-24: 50 ug via INTRAVENOUS
  Filled 2015-03-24: qty 2

## 2015-03-24 MED ORDER — HALOPERIDOL LACTATE 5 MG/ML IJ SOLN: 2.0000 mg | Freq: Once | INTRAMUSCULAR | Status: DC

## 2015-03-24 NOTE — ED Notes (Signed)
Pt verbalizes understanding of d/c instructions and denies any further needs at this time. 

## 2015-03-24 NOTE — Discharge Instructions (Signed)
Cannabis Use Disorder Cannabis use disorder is a mental disorder. It is not one-time or occasional use of cannabis, more commonly known as marijuana. Cannabis use disorder is the continued, nonmedical use of cannabis that interferes with normal life activities or causes health problems. People with cannabis use disorder get a feeling of extreme pleasure and relaxation from cannabis use. This "high" is very rewarding and causes people to use over and over.  The mind-altering ingredient in cannabis is know as THC. THC can also interfere with motor coordination, memory, judgment, and accurate sense of space and time. These effects can last for a few days after using cannabis. Regular heavy cannabis use can cause long-lasting problems with thinking and learning. In young people, these problems may be permanent. Cannabis sometimes causes severe anxiety, paranoia, or visual hallucinations. Man-made (synthetic) cannabis-like drugs, such as "spice" and "K2," cause the same effects as THC but are much stronger. Cannabis-like drugs can cause dangerously high blood pressure and heart rate.  Cannabis use disorder usually starts in the teenage years. It can trigger the development of schizophrenia. It is somewhat more common in men than women. People who have family members with the disorder or existing mental health issues such as depression and posttraumatic stress disorderare more likely to develop cannabis use disorder. People with cannabis use disorder are at higher risk for use of other drugs of abuse.  SIGNS AND SYMPTOMS Signs and symptoms of cannabis use disorder include:   Use of cannabis in larger amounts or over a longer period than intended.   Unsuccessful attempts to cut down or control cannabis use.   A lot of time spent obtaining, using, or recovering from the effects of cannabis.   A strong desire or urge to use cannabis (cravings).   Continued use of cannabis in spite of problems at work,  school, or home because of use.   Continued use of cannabis in spite of relationship problems because of use.  Giving up or cutting down on important life activities because of cannabis use.  Use of cannabis over and over even in situations when it is physically hazardous, such as when driving a car.   Continued use of cannabis in spite of a physical problem that is likely related to use. Physical problems can include:  Chronic cough.  Bronchitis.  Emphysema.  Throat and lung cancer.  Continued use of cannabis in spite of a mental problem that is likely related to use. Mental problems can include:  Psychosis.  Anxiety.  Difficulty sleeping.  Need to use more and more cannabis to get the same effect, or lessened effect over time with use of the same amount (tolerance).  Having withdrawal symptoms when cannabis use is stopped, or using cannabis to reduce or avoid withdrawal symptoms. Withdrawal symptoms include:  Irritability or anger.  Anxiety or restlessness.  Difficulty sleeping.  Loss of appetite or weight.  Aches and pains.  Shakiness.  Sweating.  Chills. DIAGNOSIS Cannabis use disorder is diagnosed by your health care provider. You may be asked questions about your cannabis use and how it affects your life. A physical exam may be done. A drug screen may be done. You may be referred to a mental health professional. The diagnosis of cannabis use disorder requires at least two symptoms within 12 months. The type of cannabis use disorder you have depends on the number of symptoms you have. The type may be:  Mild. Two or three signs and symptoms.   Moderate. Four or   five signs and symptoms.   Severe. Six or more signs and symptoms.  TREATMENT Treatment is usually provided by mental health professionals with training in substance use disorders. The following options are available:  Counseling or talk therapy. Talk therapy addresses the reasons you use  cannabis. It also addresses ways to keep you from using again. The goals of talk therapy include:  Identifying and avoiding triggers for use.  Learning how to handle cravings.  Replacing use with healthy activities.  Support groups. Support groups provide emotional support, advice, and guidance.  Medicine. Medicine is used to treat mental health issues that trigger cannabis use or that result from it. HOME CARE INSTRUCTIONS  Take medicines only as directed by your health care provider.  Check with your health care provider before starting any new medicines.  Keep all follow-up visits as directed by your health care provider. SEEK MEDICAL CARE IF:  You are not able to take your medicines as directed.  Your symptoms get worse. SEEK IMMEDIATE MEDICAL CARE IF: You have serious thoughts about hurting yourself or others. FOR MORE INFORMATION  National Institute on Drug Abuse: www.drugabuse.gov  Substance Abuse and Mental Health Services Administration: www.samhsa.gov   This information is not intended to replace advice given to you by your health care provider. Make sure you discuss any questions you have with your health care provider.   Document Released: 02/24/2000 Document Revised: 03/19/2014 Document Reviewed: 03/11/2013 Elsevier Interactive Patient Education 2016 Elsevier Inc.  

## 2015-03-24 NOTE — ED Notes (Signed)
Vomiting x 3 hours. Nausea x 2 days.

## 2015-03-24 NOTE — ED Notes (Signed)
MD at bedside, patient sleeping.

## 2015-03-24 NOTE — ED Notes (Signed)
Pt tolerated PO fluids, is currently asleep

## 2015-03-24 NOTE — ED Notes (Signed)
Pt. Has vomited yellow bile x 2 in the past hour.

## 2015-03-24 NOTE — ED Provider Notes (Signed)
CSN: [id number]     Arrival date & time 03/24/15  1527 History   First MD Initiated Contact with Patient 03/24/15 1544     Chief Complaint  Patient presents with  . Emesis      HPI Patient presents with nausea vomiting abdominal pain for last few days.  York SpanielSaid this happened for the last year off and on.  Pain and nausea since we help with hot showers.  Denies any fever chills.  Denies being pregnant.  Denies hematemesis.  Denies melena or hematochezia. Past Medical History  Diagnosis Date  . Medical history non-contributory    Past Surgical History  Procedure Laterality Date  . Induced abortion     Family History  Problem Relation Age of Onset  . Hypertension Other   . Diabetes Other   . Crohn's disease Mother   . Cancer Paternal Uncle    Social History  Substance Use Topics  . Smoking status: Former Smoker -- 0.50 packs/day    Types: Cigarettes    Quit date: 08/18/2013  . Smokeless tobacco: None  . Alcohol Use: Yes     Comment: occ   OB History    Gravida Para Term Preterm AB TAB SAB Ectopic Multiple Living   1              Review of Systems  All other systems reviewed and are negative.     Allergies  Food  Home Medications   Prior to Admission medications   Medication Sig Start Date End Date Taking? Authorizing Provider  dicyclomine (BENTYL) 20 MG tablet Take 1 tablet (20 mg total) by mouth 2 (two) times daily. 03/09/15   Shawn C Joy, PA-C  ondansetron (ZOFRAN ODT) 4 MG disintegrating tablet Take 1 tablet (4 mg total) by mouth every 8 (eight) hours as needed for nausea or vomiting. 03/09/15   Shawn C Joy, PA-C  promethazine (PHENERGAN) 12.5 MG tablet Take 1 tablet (12.5 mg total) by mouth every 6 (six) hours as needed for nausea or vomiting. Patient not taking: Reported on 03/09/2015 12/27/14   Duane LopeJennifer I Rasch, NP   BP 105/60 mmHg  Pulse 68  Temp(Src) 98.7 F (37.1 C) (Oral)  Resp 18  Ht 5\' 5"  (1.651 m)  Wt 113 lb (51.256 kg)  BMI 18.80 kg/m2  SpO2  98%  LMP 03/21/2015  Breastfeeding? Unknown Physical Exam  Constitutional: She is oriented to person, place, and time. She appears well-developed and well-nourished. No distress.  HENT:  Head: Normocephalic and atraumatic.  Eyes: Pupils are equal, round, and reactive to light.  Neck: Normal range of motion.  Cardiovascular: Normal rate and intact distal pulses.   Pulmonary/Chest: No respiratory distress.  Abdominal: Normal appearance. She exhibits no distension. There is no tenderness. There is no rebound and no guarding.  Musculoskeletal: Normal range of motion.  Neurological: She is alert and oriented to person, place, and time. No cranial nerve deficit.  Skin: Skin is warm and dry. No rash noted.  Psychiatric: She has a normal mood and affect. Her behavior is normal.  Nursing note and vitals reviewed.   ED Course  Procedures (including critical care time) Medications  promethazine (PHENERGAN) injection 25 mg (25 mg Intravenous Given 03/24/15 1649)  haloperidol lactate (HALDOL) injection 2 mg (2 mg Intravenous Not Given 03/24/15 1921)  sodium chloride 0.9 % bolus 1,000 mL (0 mLs Intravenous Stopped 03/24/15 2021)  fentaNYL (SUBLIMAZE) injection 50 mcg (50 mcg Intravenous Given 03/24/15 1816)  sodium chloride 0.9 % bolus  1,000 mL (0 mLs Intravenous Stopped 03/24/15 1932)    Labs Review Labs Reviewed  URINALYSIS, ROUTINE W REFLEX MICROSCOPIC (NOT AT [facility]) - Abnormal; Notable for the following:    APPearance CLOUDY (*)    Hgb urine dipstick LARGE (*)    Bilirubin Urine SMALL (*)    Ketones, ur >80 (*)    Protein, ur 30 (*)    All other components within normal limits  COMPREHENSIVE METABOLIC PANEL - Abnormal; Notable for the following:    ALT 10 (*)    All other components within normal limits  CBC WITH DIFFERENTIAL/PLATELET - Abnormal; Notable for the following:    Neutro Abs 8.0 (*)    All other components within normal limits  URINE RAPID DRUG SCREEN, HOSP PERFORMED -  Abnormal; Notable for the following:    Tetrahydrocannabinol POSITIVE (*)    All other components within normal limits  URINE MICROSCOPIC-ADD ON - Abnormal; Notable for the following:    Squamous Epithelial / LPF 6-30 (*)    Bacteria, UA RARE (*)    All other components within normal limits  PREGNANCY, URINE  LIPASE, BLOOD    Imaging Review No results found. I have personally reviewed and evaluated these images and lab results as part of my medical decision-making. I discussed the patient that I felt that her symptoms were secondary to CDS.  He only known treatment is drastic decrease in the use of cannabis or stopping altogether. After treatment in the ED the patient feels back to baseline and wants to go home.  MDM   Final diagnoses:  Cannabinoid hyperemesis syndrome (HCC)        Nelva Nay, MD 03/24/15 2025

## 2015-03-29 ENCOUNTER — Emergency Department (HOSPITAL_BASED_OUTPATIENT_CLINIC_OR_DEPARTMENT_OTHER)
Admission: EM | Admit: 2015-03-29 | Discharge: 2015-03-29 | Disposition: A | Discharge status: Home / Self Care | Payer: Self-pay | Attending: Emergency Medicine | Admitting: Emergency Medicine

## 2015-03-29 ENCOUNTER — Encounter (HOSPITAL_BASED_OUTPATIENT_CLINIC_OR_DEPARTMENT_OTHER): Payer: Self-pay | Admitting: *Deleted

## 2015-03-29 ENCOUNTER — Emergency Department (HOSPITAL_BASED_OUTPATIENT_CLINIC_OR_DEPARTMENT_OTHER): Payer: Self-pay

## 2015-03-29 DIAGNOSIS — J159 Unspecified bacterial pneumonia: Secondary | ICD-10-CM | POA: Insufficient documentation

## 2015-03-29 DIAGNOSIS — Z87891 Personal history of nicotine dependence: Secondary | ICD-10-CM | POA: Insufficient documentation

## 2015-03-29 DIAGNOSIS — J189 Pneumonia, unspecified organism: Secondary | ICD-10-CM

## 2015-03-29 DIAGNOSIS — Z79899 Other long term (current) drug therapy: Secondary | ICD-10-CM | POA: Insufficient documentation

## 2015-03-29 MED ORDER — DOXYCYCLINE HYCLATE 100 MG PO CAPS
100.0000 mg | ORAL_CAPSULE | Freq: Two times a day (BID) | ORAL | Status: DC
Start: 2015-03-29 — End: 2017-01-26

## 2015-03-29 MED ORDER — GUAIFENESIN ER 1200 MG PO TB12: 1.0000 | ORAL_TABLET | Freq: Two times a day (BID) | ORAL | Status: DC

## 2015-03-29 MED ORDER — ACETAMINOPHEN-CODEINE 120-12 MG/5ML PO SOLN
10.0000 mL | ORAL | Status: DC | PRN
Start: 2015-03-29 — End: 2017-01-26

## 2015-03-29 MED ORDER — IBUPROFEN 800 MG PO TABS: 800.0000 mg | ORAL_TABLET | Freq: Three times a day (TID) | ORAL | Status: DC | PRN

## 2015-03-29 MED FILL — IBUPROFEN 800 MG TABLET: 800 | 7 days supply | Qty: 21 | Fill #0

## 2015-03-29 MED FILL — DOXYCYCLINE HYC 100 MG CAP: 100 | 10 days supply | Qty: 20 | Fill #0

## 2015-03-29 MED FILL — MUCINEX ER 1,200 MG TABLET: 1200 | 14 days supply | Qty: 28 | Fill #0

## 2015-03-29 MED FILL — ACETAMINOPHEN-CODEINE ELX: 120-12 | 2 days supply | Qty: 120 | Fill #0

## 2015-03-29 NOTE — ED Provider Notes (Signed)
CSN: [id number]     Arrival date & time 03/29/15  1144 History   First MD Initiated Contact with Patient 03/29/15 1203     Chief Complaint  Patient presents with  . Follow-up     (Consider location/radiation/quality/duration/timing/severity/associated sxs/prior Treatment) HPI Patient presents to the emergency department with cough and nasal congestion for the last 3 days.  The patient states that she was here recently for another illness and states that she was told to return here if she had any worsening in her condition.  The patient, states she has also had some laryngitis associated with cough.  Patient denies chest pain, shortness breath, nausea, vomiting, weakness, dizziness, headache, blurred vision, back pain, rash, anorexia, edema, near syncope or CVA.  The patient states that she took some Alka-Seltzer cold which did not seem to relieve her symptoms dramatically.  Patient states nothing seems make her condition better or worse Past Medical History  Diagnosis Date  . Medical history non-contributory    Past Surgical History  Procedure Laterality Date  . Induced abortion     Family History  Problem Relation Age of Onset  . Hypertension Other   . Diabetes Other   . Crohn's disease Mother   . Cancer Paternal Uncle    Social History  Substance Use Topics  . Smoking status: Former Smoker -- 0.50 packs/day    Types: Cigarettes    Quit date: 08/18/2013  . Smokeless tobacco: None  . Alcohol Use: Yes     Comment: occ   OB History    Gravida Para Term Preterm AB TAB SAB Ectopic Multiple Living   1              Review of Systems  All other systems negative except as documented in the HPI. All pertinent positives and negatives as reviewed in the HPI.  Allergies  Food  Home Medications   Prior to Admission medications   Medication Sig Start Date End Date Taking? Authorizing Provider  dicyclomine (BENTYL) 20 MG tablet Take 1 tablet (20 mg total) by mouth 2 (two) times  daily. 03/09/15   Shawn C Joy, PA-C  ondansetron (ZOFRAN ODT) 4 MG disintegrating tablet Take 1 tablet (4 mg total) by mouth every 8 (eight) hours as needed for nausea or vomiting. 03/09/15   Shawn C Joy, PA-C  promethazine (PHENERGAN) 12.5 MG tablet Take 1 tablet (12.5 mg total) by mouth every 6 (six) hours as needed for nausea or vomiting. Patient not taking: Reported on 03/09/2015 12/27/14   Duane Lope, NP   BP 104/65 mmHg  Pulse 81  Temp(Src) 98.2 F (36.8 C) (Oral)  Resp 18  Ht  (1.651 m)  Wt 45.36 kg  BMI 16.64 kg/m2  SpO2 97%  LMP 03/21/2015 Physical Exam  Constitutional: She is oriented to person, place, and time. She appears well-developed and well-nourished. No distress.  HENT:  Head: Normocephalic and atraumatic.  Mouth/Throat: Oropharynx is clear and moist.  Eyes: Pupils are equal, round, and reactive to light.  Neck: Normal range of motion. Neck supple.  Cardiovascular: Normal rate, regular rhythm and normal heart sounds.  Exam reveals no gallop and no friction rub.   No murmur heard. Pulmonary/Chest: Effort normal. No respiratory distress. She has no wheezes. She has rhonchi in the right lower field.  Abdominal: Soft. Bowel sounds are normal. She exhibits no distension. There is no tenderness.  Neurological: She is alert and oriented to person, place, and time. She exhibits normal muscle tone.  Coordination normal.  Skin: Skin is warm and dry. No rash noted. No erythema.  Psychiatric: She has a normal mood and affect. Her behavior is normal.  Nursing note and vitals reviewed.   ED Course  Procedures (including critical care time) Labs Review Labs Reviewed - No data to display  Imaging Review Dg Chest 2 View  03/29/2015  CLINICAL DATA:  Cough for 4 days.  Fever and body aches. EXAM: CHEST  2 VIEW COMPARISON:  07/30/2014 FINDINGS: There is hazy left lower lobe airspace disease. There is no pleural effusion or pneumothorax. The heart and mediastinal  contours are unremarkable. The osseous structures are unremarkable. IMPRESSION: Hazy left lower lobe airspace disease concerning for pneumonia. Followup PA and lateral chest X-ray is recommended in 3-4 weeks following trial of antibiotic therapy to ensure resolution and exclude underlying malignancy. Electronically Signed   By: Elige Ko   On: 03/29/2015 13:01   I have personally reviewed and evaluated these images and lab results as part of my medical decision-making.   EKG Interpretation None      Patient be treated for community-acquired pneumonia told to return here as needed.  Patient agrees the plan and all questions were answered.  Her vital signs remained stable here in the emergency department.  She appears in no distress on examination. is advised to follow-up with her primary care doctor  Charlestine Night, PA-C 03/29/15 1334  Loren Racer, MD 03/29/15 [phone]

## 2015-03-29 NOTE — ED Notes (Addendum)
Cough, laryngitis. States she was seen 5 days ago and still has symptoms that are improving. States she was told to come back today for a recheck.

## 2015-03-29 NOTE — Discharge Instructions (Signed)
Return here as needed.  Follow-up with a primary care Dr. increase her fluid intake, rest as much as possible.  Chest x-ray did show that you have pneumonia

## 2016-05-28 ENCOUNTER — Emergency Department (HOSPITAL_COMMUNITY): Admission: EM | Admit: 2016-05-28 | Discharge: 2016-05-28 | Discharge status: Absent without leave | Payer: Self-pay

## 2016-12-27 IMAGING — US US PELVIS COMPLETE
1 series · 13 of 25 positions shown · non-contrast
Comparison: None.

CLINICAL DATA: Generalized pelvic pain.

EXAM:
TRANSABDOMINAL AND TRANSVAGINAL ULTRASOUND OF PELVIS
DOPPLER ULTRASOUND OF OVARIES
TECHNIQUE: Both transabdominal and transvaginal ultrasound examinations of the
pelvis were performed. Transabdominal technique was performed for
global imaging of the pelvis including uterus, ovaries, adnexal
regions, and pelvic cul-de-sac.
It was necessary to proceed with endovaginal exam following the
transabdominal exam to visualize the endometrium and ovaries.. Color
and duplex Doppler ultrasound was utilized to evaluate blood flow to
the ovaries.

[Series 1: us pelvis complete · 0.21mm/px · 13 of 52 slices shown]
[im 1/52]
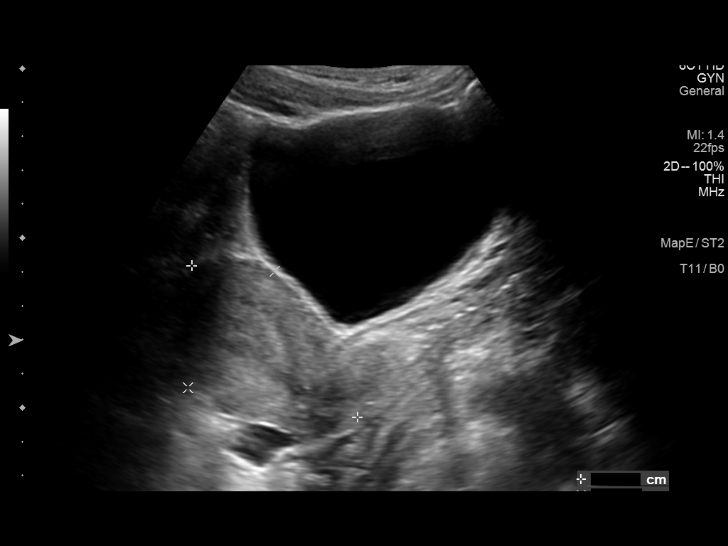
[im 5/52]
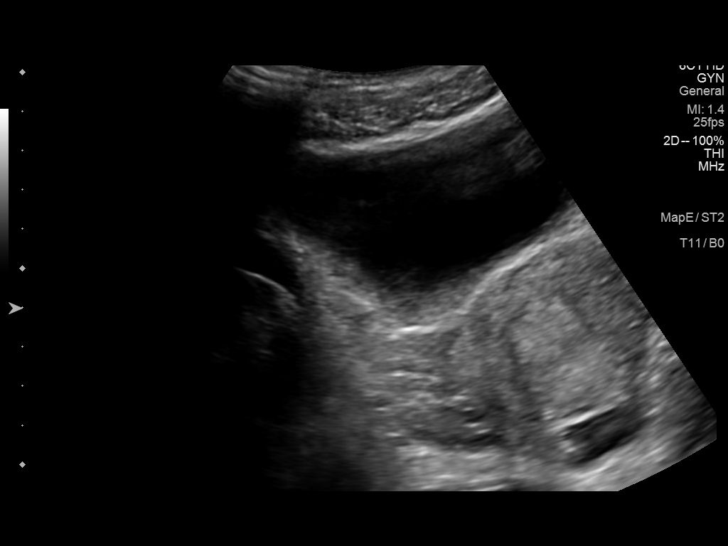
[im 9/52]
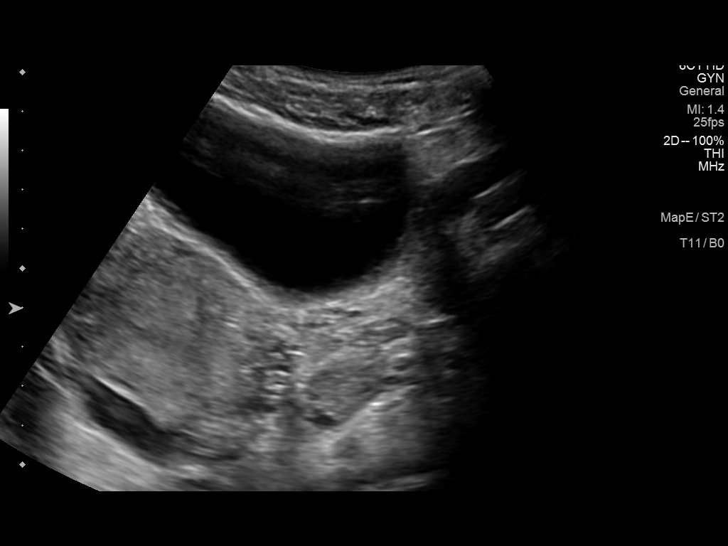
[im 13/52]
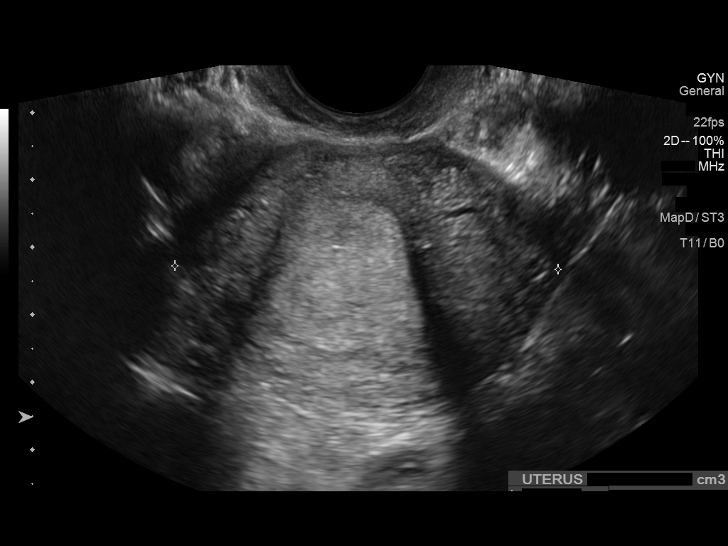
[im 18/52]
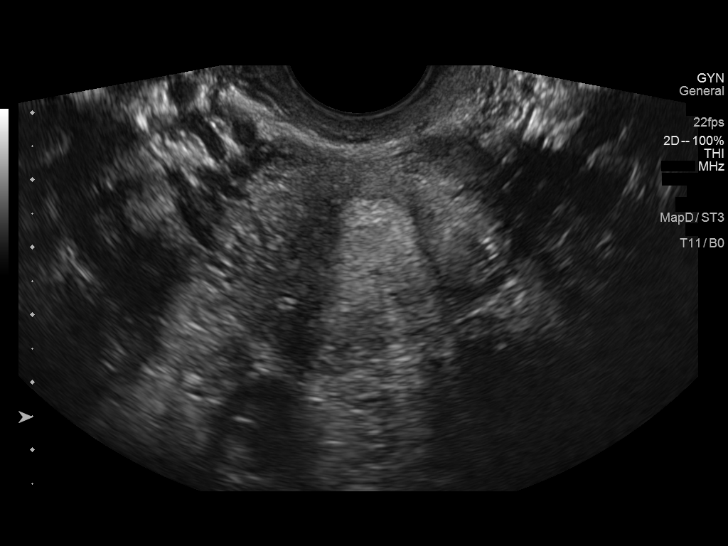
[im 22/52]
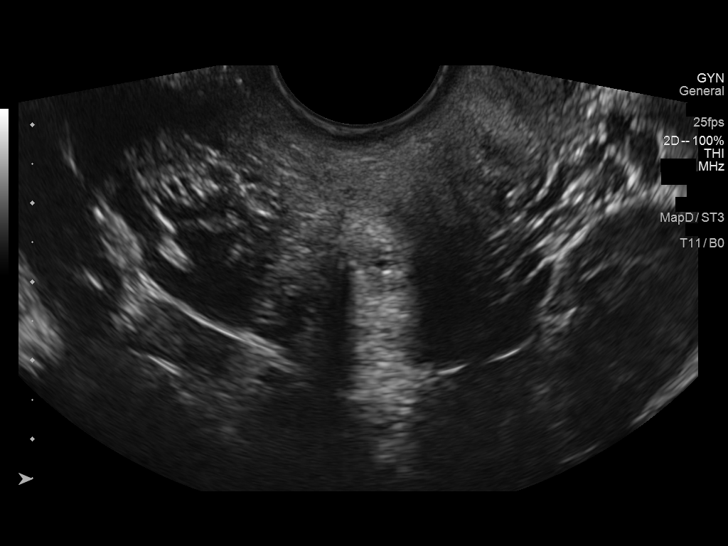
[im 26/52]
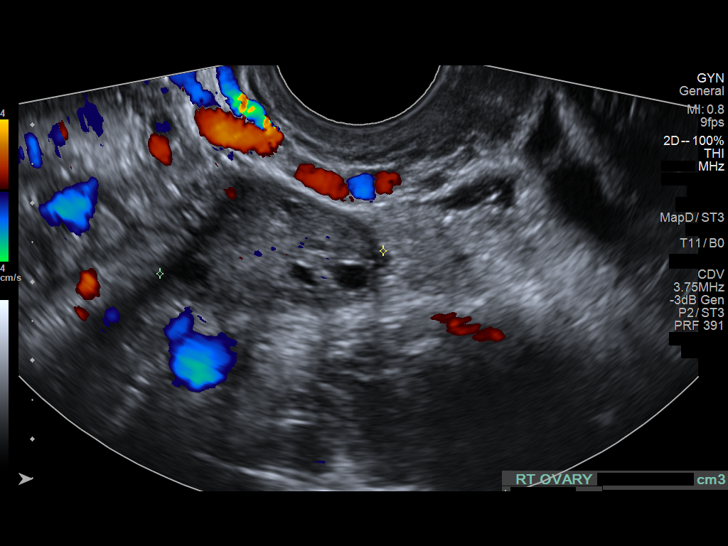
[im 30/52]
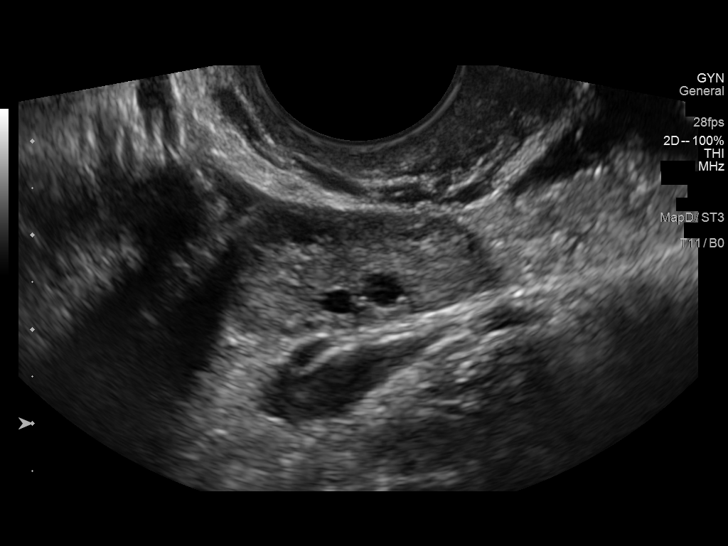
[im 35/52]
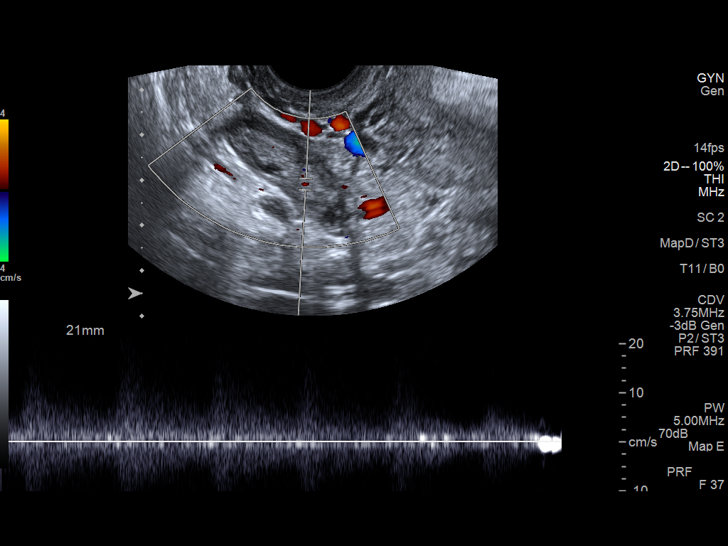
[im 39/52]
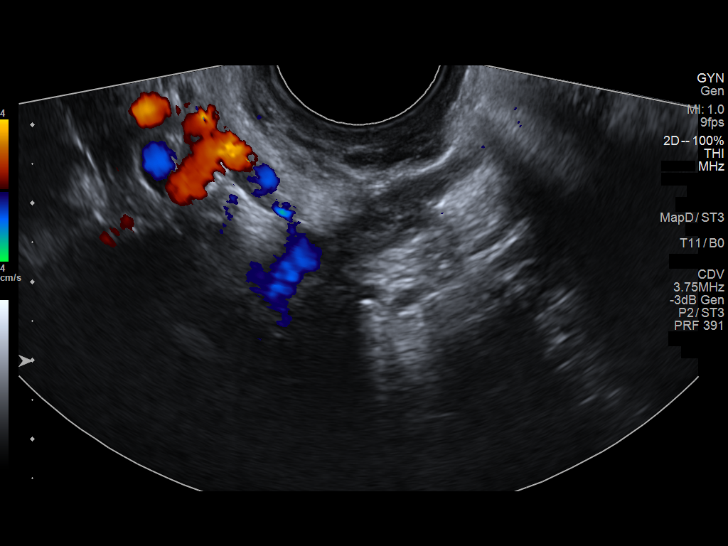
[im 43/52]
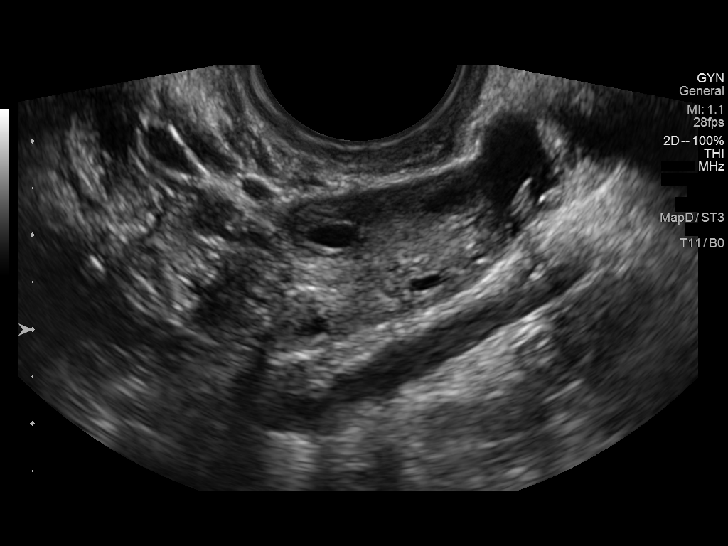
[im 47/52]
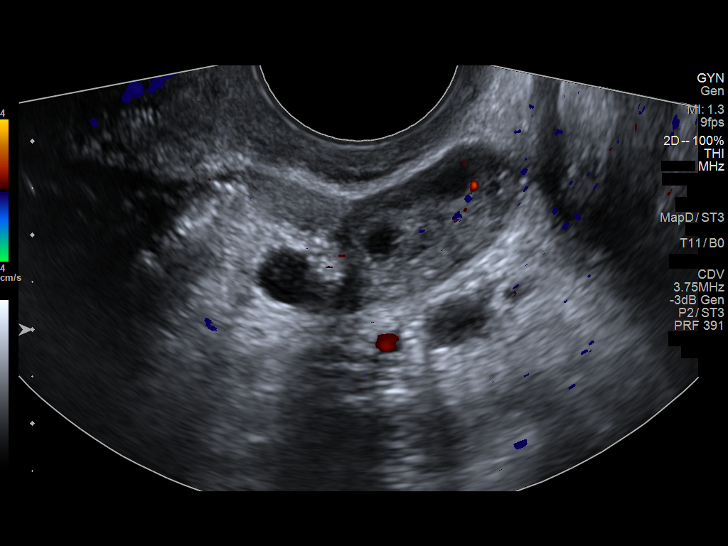
[im 52/52]
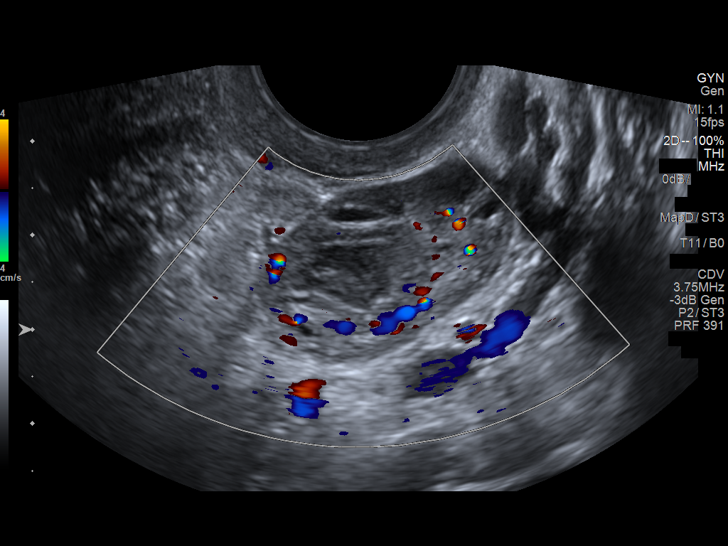

[13 of 25 positions shown; findings below may reference images not displayed]

FINDINGS: Uterus

Measurements: 7.2 x 4.2 x 5.7 cm. No fibroids or other mass
visualized.

Endometrium

Thickness: 1.46 cm.  No focal abnormality visualized.

Right ovary

Measurements: 3.2 x 1.6 x 2.9 cm. Normal appearance/no adnexal mass.

Left ovary

Measurements: 3.5 x 1.5 x 2.7 cm. There is an involuting left
ovarian cyst.

Pulsed Doppler evaluation of both ovaries demonstrates normal
low-resistance arterial and venous waveforms.

Other findings

There is a small amount of pelvic free fluid likely physiologic.
IMPRESSION: 1. No ovarian torsion.
2. Involuting left ovarian cyst.

## 2017-01-26 ENCOUNTER — Encounter (HOSPITAL_COMMUNITY): Payer: Self-pay | Admitting: Nurse Practitioner

## 2017-01-26 ENCOUNTER — Emergency Department (HOSPITAL_COMMUNITY): Payer: Self-pay

## 2017-01-26 ENCOUNTER — Emergency Department (HOSPITAL_COMMUNITY)
Admission: EM | Admit: 2017-01-26 | Discharge: 2017-01-26 | Disposition: A | Discharge status: Home / Self Care | Payer: Self-pay | Attending: Emergency Medicine | Admitting: Emergency Medicine

## 2017-01-26 DIAGNOSIS — Z87891 Personal history of nicotine dependence: Secondary | ICD-10-CM | POA: Insufficient documentation

## 2017-01-26 DIAGNOSIS — M545 Low back pain, unspecified: Secondary | ICD-10-CM

## 2017-01-26 DIAGNOSIS — G8929 Other chronic pain: Secondary | ICD-10-CM | POA: Insufficient documentation

## 2017-01-26 DIAGNOSIS — R109 Unspecified abdominal pain: Secondary | ICD-10-CM

## 2017-01-26 DIAGNOSIS — R1031 Right lower quadrant pain: Secondary | ICD-10-CM | POA: Insufficient documentation

## 2017-01-26 LAB — CBC
HCT: 39.8 % (ref 36.0–46.0)
HEMOGLOBIN: 13.9 g/dL (ref 12.0–15.0)
MCH: 31.8 pg (ref 26.0–34.0)
MCHC: 34.9 g/dL (ref 30.0–36.0)
MCV: 91.1 fL (ref 78.0–100.0)
Platelets: 236 10*3/uL (ref 150–400)
RBC: 4.37 MIL/uL (ref 3.87–5.11)
RDW: 12.5 % (ref 11.5–15.5)
WBC: 6.4 10*3/uL (ref 4.0–10.5)

## 2017-01-26 LAB — COMPREHENSIVE METABOLIC PANEL
ALK PHOS: 46 U/L (ref 38–126)
ALT: 14 U/L (ref 14–54)
ANION GAP: 9 (ref 5–15)
AST: 20 U/L (ref 15–41)
Albumin: 4.3 g/dL (ref 3.5–5.0)
BILIRUBIN TOTAL: 0.4 mg/dL (ref 0.3–1.2)
BUN: 10 mg/dL (ref 6–20)
CALCIUM: 8.9 mg/dL (ref 8.9–10.3)
CO2: 23 mmol/L (ref 22–32)
Chloride: 105 mmol/L (ref 101–111)
Creatinine, Ser: 0.7 mg/dL (ref 0.44–1.00)
Glucose, Bld: 108 mg/dL — ABNORMAL HIGH (ref 65–99)
Potassium: 3 mmol/L — ABNORMAL LOW (ref 3.5–5.1)
Sodium: 137 mmol/L (ref 135–145)
TOTAL PROTEIN: 7 g/dL (ref 6.5–8.1)

## 2017-01-26 LAB — URINALYSIS, ROUTINE W REFLEX MICROSCOPIC
BILIRUBIN URINE: NEGATIVE
Bacteria, UA: NONE SEEN
Glucose, UA: NEGATIVE mg/dL
KETONES UR: NEGATIVE mg/dL
LEUKOCYTES UA: NEGATIVE
NITRITE: NEGATIVE
Protein, ur: NEGATIVE mg/dL
SPECIFIC GRAVITY, URINE: 1.005 (ref 1.005–1.030)
pH: 6 (ref 5.0–8.0)

## 2017-01-26 LAB — I-STAT BETA HCG BLOOD, ED (MC, WL, AP ONLY): I-stat hCG, quantitative: <5 m[IU]/mL (ref <5)

## 2017-01-26 LAB — LIPASE, BLOOD: Lipase: 37 U/L (ref 11–51)

## 2017-01-26 MED ORDER — IOPAMIDOL (ISOVUE-300) INJECTION 61%
15.0000 mL | Freq: Once | INTRAVENOUS | Status: AC | PRN
  Administered 2017-01-26: 15 mL via ORAL

## 2017-01-26 MED ORDER — ONDANSETRON HCL 4 MG/2ML IJ SOLN
4.0000 mg | Freq: Once | INTRAMUSCULAR | Status: AC
  Administered 2017-01-26: 4 mg via INTRAVENOUS
  Filled 2017-01-26: qty 2

## 2017-01-26 MED ORDER — MORPHINE SULFATE (PF) 4 MG/ML IV SOLN
4.0000 mg | Freq: Once | INTRAVENOUS | Status: AC
  Administered 2017-01-26: 4 mg via INTRAVENOUS
  Filled 2017-01-26: qty 1

## 2017-01-26 MED ORDER — IOPAMIDOL (ISOVUE-300) INJECTION 61%
100.0000 mL | Freq: Once | INTRAVENOUS | Status: AC | PRN
  Administered 2017-01-26: 100 mL via INTRAVENOUS

## 2017-01-26 MED ORDER — IOPAMIDOL (ISOVUE-300) INJECTION 61%
INTRAVENOUS | Status: AC
  Filled 2017-01-26: qty 30

## 2017-01-26 MED ORDER — IOPAMIDOL (ISOVUE-300) INJECTION 61%
INTRAVENOUS | Status: DC
Start: 2017-01-26 — End: 2017-01-26
  Filled 2017-01-26: qty 100

## 2017-01-26 NOTE — ED Triage Notes (Signed)
Pt is c/o diffuse abdominal pain bilateral flank-back pain and severe headaches, symptom onset she reports about a week ago.

## 2017-01-26 NOTE — ED Provider Notes (Signed)
[facility]-EMERGENCY DEPT Provider Note   CSN: [id number] Arrival date & time: 01/26/17  0122     History   Chief Complaint Chief Complaint  Patient presents with  . Abdominal Pain  . Back Pain  . Headache    HPI Linda Wallace is a 26 y.o. female.  Patient presents to the ED with a chief complaint of abdominal pain x 3 months, back pain and headache that started about a week ago.  She denies any fevers, chills, nausea or vomiting.  She denies any dysuria or hematuria.  Denies any vaginal discharge or bleeding.  She has not taken anything for her symptoms.  There are no modifying factors.   The history is provided by the patient. No language interpreter was used.    Past Medical History:  Diagnosis Date  . Medical history non-contributory     There are no active problems to display for this patient.   Past Surgical History:  Procedure Laterality Date  . INDUCED ABORTION      OB History    Gravida Para Term Preterm AB Living   1             SAB TAB Ectopic Multiple Live Births                   Home Medications    Prior to Admission medications   Medication Sig Start Date End Date Taking? Authorizing Provider  ibuprofen (ADVIL,MOTRIN) 200 MG tablet Take 400 mg every 6 (six) hours as needed by mouth for headache or mild pain.   Yes [provider]  Guaifenesin 1200 MG TB12 Take 1 tablet (1,200 mg total) by mouth 2 (two) times daily. Patient not taking: Reported on 01/26/2017 03/29/15   Charlestine NightLawyer, Christopher, PA-C    Family History Family History  Problem Relation Age of Onset  . Hypertension Other   . Diabetes Other   . Crohn's disease Mother   . Cancer Paternal Uncle     Social History Social History   Tobacco Use  . Smoking status: Former Smoker    Packs/day: 0.50    Types: Cigarettes    Last attempt to quit: 08/18/2013    Years since quitting: 3.4  . Smokeless tobacco: Never Used  Substance Use Topics  . Alcohol  use: Yes    Comment: occ  . Drug use: Yes    Types: Marijuana     Allergies   Food   Review of Systems Review of Systems  All other systems reviewed and are negative.    Physical Exam Updated Vital Signs BP 104/66   Pulse 74   Temp 98.7 F (37.1 C) (Oral)   Resp 16   LMP 01/25/2017 (Exact Date)   SpO2 96%   Physical Exam  Constitutional: She is oriented to person, place, and time. She appears well-developed and well-nourished.  HENT:  Head: Normocephalic and atraumatic.  Eyes: Conjunctivae and EOM are normal. Pupils are equal, round, and reactive to light.  Neck: Normal range of motion. Neck supple.  Cardiovascular: Normal rate and regular rhythm. Exam reveals no gallop and no friction rub.  No murmur heard. Pulmonary/Chest: Effort normal and breath sounds normal. No respiratory distress. She has no wheezes. She has no rales. She exhibits no tenderness.  Abdominal: Soft. Bowel sounds are normal. She exhibits no distension and no mass. There is tenderness in the right lower quadrant. There is no rebound and no guarding.  Musculoskeletal: Normal range of motion. She  exhibits no edema or tenderness.  Neurological: She is alert and oriented to person, place, and time.  Skin: Skin is warm and dry.  Psychiatric: She has a normal mood and affect. Her behavior is normal. Judgment and thought content normal.  Nursing note and vitals reviewed.    ED Treatments / Results  Labs (all labs ordered are listed, but only abnormal results are displayed) Labs Reviewed  COMPREHENSIVE METABOLIC PANEL - Abnormal; Notable for the following components:      Result Value   Potassium 3.0 (*)    Glucose, Bld 108 (*)    All other components within normal limits  URINALYSIS, ROUTINE W REFLEX MICROSCOPIC - Abnormal; Notable for the following components:   Color, Urine STRAW (*)    Hgb urine dipstick SMALL (*)    Squamous Epithelial / LPF 0-5 (*)    All other components within normal  limits  LIPASE, BLOOD  CBC  I-STAT BETA HCG BLOOD, ED ([facility], [facility], [facility] ONLY)    EKG  EKG Interpretation None       Radiology Ct Abdomen Pelvis W Contrast  Result Date: 01/26/2017 CLINICAL DATA:  Unspecified abdominal pain.  Bilateral flank pain. EXAM: CT ABDOMEN AND PELVIS WITH CONTRAST TECHNIQUE: Multidetector CT imaging of the abdomen and pelvis was performed using the standard protocol following bolus administration of intravenous contrast. CONTRAST:  15mL ISOVUE-300 IOPAMIDOL (ISOVUE-300) INJECTION 61%, 100mL ISOVUE-300 IOPAMIDOL (ISOVUE-300) INJECTION 61% COMPARISON:  None. FINDINGS: Lower chest: The lung bases are clear without focal nodule, mass, or airspace disease. The heart size is normal. No significant pleural or pericardial effusion is present. Hepatobiliary: No focal liver abnormality is seen. No gallstones, gallbladder wall thickening, or biliary dilatation. Pancreas: Unremarkable. No pancreatic ductal dilatation or surrounding inflammatory changes. Spleen: Normal in size without focal abnormality. Adrenals/Urinary Tract: The adrenal glands are normal bilaterally. Kidneys and ureters are within normal limits. The urinary bladder is within normal limits. Stomach/Bowel: The stomach and duodenum are within normal limits. Small bowel is unremarkable. Terminal ileum is within normal limits. The appendix is visualized and normal. The ascending and transverse colon are within normal limits. Descending colon is unremarkable. The rectum is within normal limits. Vascular/Lymphatic: No significant vascular findings are present. No enlarged abdominal or pelvic lymph nodes. Reproductive: Uterus and adnexa are within normal limits. A tampon is present within the vagina. Other: No abdominal wall hernia or abnormality. No abdominopelvic ascites. Musculoskeletal: No acute or significant osseous findings. IMPRESSION: No acute or focal abnormality of the abdomen or pelvis to explain the patient's abdominal  and flank pain. Electronically Signed   By: Marin Robertshristopher  Mattern M.D.   On: 01/26/2017 06:55    Procedures Procedures (including critical care time)  Medications Ordered in ED Medications  iopamidol (ISOVUE-300) 61 % injection (not administered)  iopamidol (ISOVUE-300) 61 % injection (not administered)  morphine 4 MG/ML injection 4 mg (4 mg Intravenous Given 01/26/17 0501)  ondansetron (ZOFRAN) injection 4 mg (4 mg Intravenous Given 01/26/17 0501)  iopamidol (ISOVUE-300) 61 % injection 15 mL (15 mLs Oral Contrast Given 01/26/17 0531)  iopamidol (ISOVUE-300) 61 % injection 100 mL (100 mLs Intravenous Contrast Given 01/26/17 0637)     Initial Impression / Assessment and Plan / ED Course  I have reviewed the triage vital signs and the nursing notes.  Pertinent labs & imaging results that were available during my care of the patient were reviewed by me and considered in my medical decision making (see chart for details).  Patient with chronic abdominal pain and new back pain.  No fevers.  VSS.  Somewhat tender in the RLQ.  Will check labs and CT.  Patient is stable and very well appearing.    CT is unremarkable for acute findings.  HCG negative.  No evidence of UTI.  Abdomen is soft.  Discussed discharge and close follow-up with patient.  She understands and agrees with the plan.  Final Clinical Impressions(s) / ED Diagnoses   Final diagnoses:  Abdominal pain, unspecified abdominal location  Low back pain without sciatica, unspecified back pain laterality, unspecified chronicity    ED Discharge Orders    None       Roxy Horseman, PA-C 01/26/17 2209    Melene Plan, DO 01/26/17 2259

## 2017-07-17 ENCOUNTER — Other Ambulatory Visit: Payer: Self-pay

## 2017-07-17 ENCOUNTER — Emergency Department (HOSPITAL_COMMUNITY)
Admission: EM | Admit: 2017-07-17 | Discharge: 2017-07-18 | Disposition: A | Discharge status: Home / Self Care | Payer: Self-pay | Attending: Emergency Medicine | Admitting: Emergency Medicine

## 2017-07-17 ENCOUNTER — Emergency Department (HOSPITAL_COMMUNITY): Payer: Self-pay

## 2017-07-17 ENCOUNTER — Encounter (HOSPITAL_COMMUNITY): Payer: Self-pay | Admitting: Emergency Medicine

## 2017-07-17 DIAGNOSIS — R079 Chest pain, unspecified: Secondary | ICD-10-CM | POA: Insufficient documentation

## 2017-07-17 DIAGNOSIS — R51 Headache: Secondary | ICD-10-CM | POA: Insufficient documentation

## 2017-07-17 DIAGNOSIS — R000 Tachycardia, unspecified: Secondary | ICD-10-CM | POA: Insufficient documentation

## 2017-07-17 DIAGNOSIS — Z5321 Procedure and treatment not carried out due to patient leaving prior to being seen by health care provider: Secondary | ICD-10-CM | POA: Insufficient documentation

## 2017-07-17 LAB — CBC
HCT: 42.5 % (ref 36.0–46.0)
Hemoglobin: 14.7 g/dL (ref 12.0–15.0)
MCH: 31.4 pg (ref 26.0–34.0)
MCHC: 34.6 g/dL (ref 30.0–36.0)
MCV: 90.8 fL (ref 78.0–100.0)
Platelets: 270 10*3/uL (ref 150–400)
RBC: 4.68 MIL/uL (ref 3.87–5.11)
RDW: 13 % (ref 11.5–15.5)
WBC: 8.2 10*3/uL (ref 4.0–10.5)

## 2017-07-17 LAB — I-STAT BETA HCG BLOOD, ED (MC, WL, AP ONLY)

## 2017-07-17 LAB — BASIC METABOLIC PANEL
Anion gap: 10 (ref 5–15)
BUN: 11 mg/dL (ref 6–20)
CHLORIDE: 102 mmol/L (ref 101–111)
CO2: 25 mmol/L (ref 22–32)
Calcium: 9.5 mg/dL (ref 8.9–10.3)
Creatinine, Ser: 0.8 mg/dL (ref 0.44–1.00)
GFR calc Af Amer: >60 mL/min (ref >60)
GLUCOSE: 73 mg/dL (ref 65–99)
POTASSIUM: 3.6 mmol/L (ref 3.5–5.1)
Sodium: 137 mmol/L (ref 135–145)

## 2017-07-17 LAB — I-STAT TROPONIN, ED: TROPONIN I, POC: 0 ng/mL (ref 0.00–0.08)

## 2017-07-17 NOTE — ED Triage Notes (Signed)
Pt reports generalized chest pain starting yesterday. Pt reports her HR was in 150s, EMS was called but she did not come to the hospital. Pt also reports dizziness, nausea, no vomiting and headache starting yesterday. Pt reports no difficulty keeping fluids down

## 2017-07-18 ENCOUNTER — Emergency Department (HOSPITAL_COMMUNITY): Payer: Self-pay

## 2017-07-18 ENCOUNTER — Emergency Department (HOSPITAL_COMMUNITY)
Admission: EM | Admit: 2017-07-18 | Discharge: 2017-07-18 | Disposition: A | Discharge status: Home / Self Care | Payer: Self-pay | Attending: Emergency Medicine | Admitting: Emergency Medicine

## 2017-07-18 ENCOUNTER — Encounter (HOSPITAL_COMMUNITY): Payer: Self-pay | Admitting: Emergency Medicine

## 2017-07-18 DIAGNOSIS — Z87891 Personal history of nicotine dependence: Secondary | ICD-10-CM | POA: Insufficient documentation

## 2017-07-18 DIAGNOSIS — R0789 Other chest pain: Secondary | ICD-10-CM | POA: Insufficient documentation

## 2017-07-18 LAB — BASIC METABOLIC PANEL
Anion gap: 7 (ref 5–15)
BUN: 14 mg/dL (ref 6–20)
CALCIUM: 9.1 mg/dL (ref 8.9–10.3)
CHLORIDE: 107 mmol/L (ref 101–111)
CO2: 26 mmol/L (ref 22–32)
CREATININE: 0.72 mg/dL (ref 0.44–1.00)
GFR calc Af Amer: >60 mL/min (ref >60)
GFR calc non Af Amer: >60 mL/min (ref >60)
GLUCOSE: 88 mg/dL (ref 65–99)
Potassium: 4.2 mmol/L (ref 3.5–5.1)
Sodium: 140 mmol/L (ref 135–145)

## 2017-07-18 LAB — I-STAT TROPONIN, ED: Troponin i, poc: 0 ng/mL (ref 0.00–0.08)

## 2017-07-18 LAB — CBC
HCT: 43.1 % (ref 36.0–46.0)
HEMOGLOBIN: 14.5 g/dL (ref 12.0–15.0)
MCH: 31 pg (ref 26.0–34.0)
MCHC: 33.6 g/dL (ref 30.0–36.0)
MCV: 92.3 fL (ref 78.0–100.0)
PLATELETS: 242 10*3/uL (ref 150–400)
RBC: 4.67 MIL/uL (ref 3.87–5.11)
RDW: 13.1 % (ref 11.5–15.5)
WBC: 5.9 10*3/uL (ref 4.0–10.5)

## 2017-07-18 LAB — I-STAT BETA HCG BLOOD, ED (MC, WL, AP ONLY): I-stat hCG, quantitative: <5 m[IU]/mL (ref <5)

## 2017-07-18 MED ORDER — KETOROLAC TROMETHAMINE 15 MG/ML IJ SOLN
30.0000 mg | Freq: Once | INTRAMUSCULAR | Status: AC
  Administered 2017-07-18: 30 mg via INTRAMUSCULAR
  Filled 2017-07-18: qty 2

## 2017-07-18 NOTE — ED Triage Notes (Signed)
Patient here from home with complaints of generalized chest pain and dizziness x days. Nausea, no vomiting.

## 2017-07-18 NOTE — ED Notes (Signed)
No answer for blood draw @ this time. 

## 2017-07-18 NOTE — ED Notes (Signed)
Pt is very upset about wait time. Pt encouraged to stay. Mother and pt turn in labels to registration and are seen leaving lobby.

## 2017-07-18 NOTE — ED Provider Notes (Signed)
[facility]-EMERGENCY DEPT Provider Note   CSN: [id number] Arrival date & time: 07/18/17  1033     History   Chief Complaint Chief Complaint  Patient presents with  . Chest Pain    HPI Linda Wallace is a 27 y.o. female.  27 year old female without significant prior medical history presents with complaint of generalized malaise, fatigue, and diffuse muscular pain.  Patient reports that she started feeling bad yesterday.  She went to the [facility] ED yesterday for same complaint.  She apparently sat there all night long and then decided to come to [facility] ED this morning for evaluation.  She reports diffuse achy pain all over.  She denies fever.  She denies shortness of breath.  She has not taken anything for her symptoms at home. She reports that yesterday morning she began to gradually feel "achy."     The history is provided by the patient.  Illness  This is a new problem. The current episode started yesterday. The problem occurs rarely. The problem has not changed since onset.Pertinent negatives include no chest pain, no abdominal pain, no headaches and no shortness of breath. Nothing aggravates the symptoms. Nothing relieves the symptoms. She has tried nothing for the symptoms.    Past Medical History:  Diagnosis Date  . Medical history non-contributory     There are no active problems to display for this patient.   Past Surgical History:  Procedure Laterality Date  . INDUCED ABORTION       OB History    Gravida  1   Para      Term      Preterm      AB      Living        SAB      TAB      Ectopic      Multiple      Live Births               Home Medications    Prior to Admission medications   Medication Sig Start Date End Date Taking? Authorizing Provider  ibuprofen (ADVIL,MOTRIN) 200 MG tablet Take 400 mg every 6 (six) hours as needed by mouth for headache or mild pain.   Yes [provider]     Family History Family History  Problem Relation Age of Onset  . Hypertension Other   . Diabetes Other   . Crohn's disease Mother   . Cancer Paternal Uncle     Social History Social History   Tobacco Use  . Smoking status: Former Smoker    Packs/day: 0.50    Types: Cigarettes    Last attempt to quit: 08/18/2013    Years since quitting: 3.9  . Smokeless tobacco: Never Used  Substance Use Topics  . Alcohol use: Yes    Comment: occ  . Drug use: Yes    Types: Marijuana     Allergies   Food   Review of Systems Review of Systems  Constitutional: Positive for fatigue. Negative for fever.       Malaise   Diffuse Muscle pain   Respiratory: Negative for shortness of breath.   Cardiovascular: Negative for chest pain.  Gastrointestinal: Negative for abdominal pain.  Neurological: Negative for headaches.  All other systems reviewed and are negative.    Physical Exam Updated Vital Signs BP 116/83 (BP Location: Left Arm)   Pulse 84   Temp 98.5 F (36.9 C) (Oral)   Resp 17  LMP 06/26/2017   SpO2 99%   Physical Exam  Constitutional: She is oriented to person, place, and time. She appears well-developed and well-nourished. No distress.  HENT:  Head: Normocephalic and atraumatic.  Mouth/Throat: Oropharynx is clear and moist.  Eyes: Pupils are equal, round, and reactive to light. Conjunctivae and EOM are normal.  Neck: Normal range of motion. Neck supple.  Cardiovascular: Normal rate, regular rhythm and normal heart sounds.  Pulmonary/Chest: Effort normal and breath sounds normal. No respiratory distress.  Abdominal: Soft. She exhibits no distension. There is no tenderness.  Musculoskeletal: Normal range of motion. She exhibits no edema or deformity.  Neurological: She is alert and oriented to person, place, and time.  Skin: Skin is warm and dry.  Psychiatric: She has a normal mood and affect.  Nursing note and vitals reviewed.    ED Treatments / Results   Labs (all labs ordered are listed, but only abnormal results are displayed) Labs Reviewed  BASIC METABOLIC PANEL  CBC  I-STAT TROPONIN, ED  I-STAT BETA HCG BLOOD, ED ([facility], [facility], [facility] ONLY)    EKG EKG Interpretation  Date/Time:  Thursday Jul 18 2017 10:53:17 EDT Ventricular Rate:  87 PR Interval:    QRS Duration: 77 QT Interval:  350 QTC Calculation: 421 R Axis:   93 Text Interpretation:  Sinus rhythm Borderline right axis deviation RSR' in V1 or V2, probably normal variant Borderline Q waves in lateral leads Confirmed by Kristine Royal [phone]) on 07/18/2017 5:17:35 PM   Radiology Dg Chest 2 View  Result Date: 07/18/2017 CLINICAL DATA:  Chest pain and dizziness for 2 days. EXAM: CHEST - 2 VIEW COMPARISON:  PA and lateral chest 07/17/2017 and 03/29/2015. FINDINGS: The lungs are clear. Heart size is normal. No pneumothorax or pleural effusion. No bony abnormality. IMPRESSION: Negative chest. Electronically Signed   By: Drusilla Kanner M.D.   On: 07/18/2017 11:41   Dg Chest 2 View  Result Date: 07/17/2017 CLINICAL DATA:  Centralized chest pain.  Dizziness. EXAM: CHEST - 2 VIEW COMPARISON:  03/29/2015. FINDINGS: The heart size and mediastinal contours are within normal limits. Both lungs are clear. The visualized skeletal structures are unremarkable. IMPRESSION: No active cardiopulmonary disease. Improved aeration compared with priors. Electronically Signed   By: Elsie Stain M.D.   On: 07/17/2017 22:47    Procedures Procedures (including critical care time)  Medications Ordered in ED Medications  ketorolac (TORADOL) 15 MG/ML injection 30 mg (has no administration in time range)     Initial Impression / Assessment and Plan / ED Course  I have reviewed the triage vital signs and the nursing notes.  Pertinent labs & imaging results that were available during my care of the patient were reviewed by me and considered in my medical decision making (see chart for details).      MDM  Screen Complete  Patient is presenting for evaluation of diffuse muscle pain.  Screening EKG is within normal limits.  Chest x-ray does not suggest acute abnormality.  Screening baseline labs are also otherwise normal.  Patient feels significantly improved following administration of Toradol.  She now desires discharge home.  Symptoms are most suggestive of a possible viral infection.  Close follow-up is advised.  Strict return precautions were given and understood by the patient.   Final Clinical Impressions(s) / ED Diagnoses   Final diagnoses:  Atypical chest pain    ED Discharge Orders    None       Wynetta Fines, MD 07/18/17  1934  

## 2017-09-30 ENCOUNTER — Inpatient Hospital Stay (HOSPITAL_COMMUNITY)
Admission: AD | Admit: 2017-09-30 | Discharge: 2017-09-30 | Disposition: A | Discharge status: Home / Self Care | Payer: Self-pay | Source: Ambulatory Visit | Attending: Obstetrics and Gynecology | Admitting: Obstetrics and Gynecology

## 2017-09-30 ENCOUNTER — Ambulatory Visit (INDEPENDENT_AMBULATORY_CARE_PROVIDER_SITE_OTHER): Payer: Self-pay | Admitting: Advanced Practice Midwife

## 2017-09-30 ENCOUNTER — Encounter: Payer: Self-pay | Admitting: Advanced Practice Midwife

## 2017-09-30 ENCOUNTER — Encounter (HOSPITAL_COMMUNITY): Payer: Self-pay | Admitting: Obstetrics and Gynecology

## 2017-09-30 VITALS — BP 111/56 | HR 71 | Ht 65.0 in | Wt 106.3 lb

## 2017-09-30 DIAGNOSIS — N912 Amenorrhea, unspecified: Secondary | ICD-10-CM

## 2017-09-30 DIAGNOSIS — Z3202 Encounter for pregnancy test, result negative: Secondary | ICD-10-CM | POA: Insufficient documentation

## 2017-09-30 LAB — URINALYSIS, ROUTINE W REFLEX MICROSCOPIC
Bilirubin Urine: NEGATIVE
Glucose, UA: NEGATIVE mg/dL
Hgb urine dipstick: NEGATIVE
KETONES UR: NEGATIVE mg/dL
Leukocytes, UA: NEGATIVE
NITRITE: NEGATIVE
Protein, ur: NEGATIVE mg/dL
Specific Gravity, Urine: 1.019 (ref 1.005–1.030)
pH: 7 (ref 5.0–8.0)

## 2017-09-30 LAB — POCT PREGNANCY, URINE: Preg Test, Ur: NEGATIVE

## 2017-09-30 MED ORDER — MEDROXYPROGESTERONE ACETATE 10 MG PO TABS
10.0000 mg | ORAL_TABLET | Freq: Every day | ORAL | 0 refills | Status: DC
Start: 2017-09-30 — End: 2018-07-19

## 2017-09-30 NOTE — Progress Notes (Signed)
Pt states she has many sx of pregnancy but all urine pregnancy tests have been negative. She has been eating more than usual, has a swollen belly, severe fatigue, pelvic pain, back pain and slight lightheadedness. She also has developed cystic acne 1 month ago which she has never had previously. LMP was 08/02/17. She had 3 days of bleeding beginning 7/4 after intercourse which did not seem like a normal period.

## 2017-09-30 NOTE — Progress Notes (Signed)
  Subjective:     Patient ID: Linda Wallace, female   DOB: 1991/03/12, 27 y.o.   MRN: [id number]  Linda Wallace is a 27 y.o. G2P0020 who presents today with missed menses, 4lbs weight gain in 3 weeks and her abdomen is getting bigger. She has had several negative UPTs at home, and here today as well.  Other  This is a new problem. Episode onset: One month  The problem has been unchanged. Associated symptoms include abdominal pain, a change in bowel habit and nausea. Pertinent negatives include no chills or fever. Nothing aggravates the symptoms. She has tried nothing for the symptoms.     Review of Systems  Constitutional: Negative for chills and fever.  Gastrointestinal: Positive for abdominal pain, change in bowel habit, constipation and nausea.  Genitourinary: Negative for dysuria and pelvic pain.  Skin:       Dry skin on legs and face Cystic acne that has developed in the last month        Objective:   Physical Exam  Constitutional: She is oriented to person, place, and time. She appears well-developed and well-nourished. No distress.  HENT:  Head: Normocephalic.  Cardiovascular: Normal rate.  Pulmonary/Chest: Effort normal.  Abdominal: Soft. There is no tenderness. There is no guarding.  Neurological: She is alert and oriented to person, place, and time.  Skin: Skin is warm and dry.  Cystic acne   Psychiatric: She has a normal mood and affect.  Nursing note and vitals reviewed.  Results for orders placed or performed during the hospital encounter of 09/30/17 (from the past 24 hour(s))  Urinalysis, Routine w reflex microscopic     Status: Abnormal   Collection Time: 09/30/17  5:52 PM  Result Value Ref Range   Color, Urine YELLOW YELLOW   APPearance HAZY (A) CLEAR   Specific Gravity, Urine 1.019 1.005 - 1.030   pH 7.0 5.0 - 8.0   Glucose, UA NEGATIVE NEGATIVE mg/dL   Hgb urine dipstick NEGATIVE NEGATIVE   Bilirubin Urine NEGATIVE NEGATIVE   Ketones, ur NEGATIVE  NEGATIVE mg/dL   Protein, ur NEGATIVE NEGATIVE mg/dL   Nitrite NEGATIVE NEGATIVE   Leukocytes, UA NEGATIVE NEGATIVE  Pregnancy, urine POC     Status: None   Collection Time: 09/30/17  5:59 PM  Result Value Ref Range   Preg Test, Ur NEGATIVE NEGATIVE       Assessment:     1. Amenorrhea        Plan:      Progesterone challenge  Orders Placed This Encounter  Procedures  . Thyroid Panel With TSH    Standing Status:   Future    Standing Expiration Date:   10/01/2018  . Prolactin    Standing Status:   Future    Standing Expiration Date:   10/01/2018

## 2017-09-30 NOTE — MAU Provider Note (Signed)
S:  Linda Wallace is a 27 y.o. female G1P0 non pregnant female here with missed period. She denies pain or bleeding at this time. Says occasionally she has pelvic pain. She feels like her abdomen is growing and she wants to know why she missed her period.  MSE done. Patient offered to walk down to the walk in clinic at [facility]. Patient is agreeable and thankful.  Patient walked down to the WOC by nursing staff.  Results for orders placed or performed during the hospital encounter of 09/30/17 (from the past 48 hour(s))  Urinalysis, Routine w reflex microscopic     Status: Abnormal   Collection Time: 09/30/17  5:52 PM  Result Value Ref Range   Color, Urine YELLOW YELLOW   APPearance HAZY (A) CLEAR   Specific Gravity, Urine 1.019 1.005 - 1.030   pH 7.0 5.0 - 8.0   Glucose, UA NEGATIVE NEGATIVE mg/dL   Hgb urine dipstick NEGATIVE NEGATIVE   Bilirubin Urine NEGATIVE NEGATIVE   Ketones, ur NEGATIVE NEGATIVE mg/dL   Protein, ur NEGATIVE NEGATIVE mg/dL   Nitrite NEGATIVE NEGATIVE   Leukocytes, UA NEGATIVE NEGATIVE    Comment: Performed at [facility], [address] [place], [place] [id number]  Pregnancy, urine POC     Status: None   Collection Time: 09/30/17  5:59 PM  Result Value Ref Range   Preg Test, Ur NEGATIVE NEGATIVE    Comment:        THE SENSITIVITY OF THIS METHODOLOGY IS >24 mIU/mL     Duane Lopeasch, Jennifer I, NP 09/30/2017 7:32 PM

## 2017-09-30 NOTE — MAU Note (Signed)
Pt sent to clinic for evaluation  

## 2017-09-30 NOTE — Patient Instructions (Signed)
Secondary Amenorrhea Secondary amenorrhea is the stopping of menstrual flow for 3-6 months in a female who has previously had periods. There are many possible causes. Most of these causes are not serious. Usually, treating the underlying problem causing the loss of menses will return your periods to normal. What are the causes? Some common and uncommon causes of not menstruating include:  Malnutrition.  Low blood sugar (hypoglycemia).  Polycystic ovary disease.  Stress or fear.  Breastfeeding.  Hormone imbalance.  Ovarian failure.  Medicines.  Extreme obesity.  Cystic fibrosis.  Low body weight or drastic weight reduction from any cause.  Early menopause.  Removal of ovaries or uterus.  Contraceptives.  Illness.  Long-term (chronic) illnesses.  Cushing syndrome.  Thyroid problems.  Birth control pills, patches, or vaginal rings for birth control.  What increases the risk? You may be at greater risk of secondary amenorrhea if:  You have a family history of this condition.  You have an eating disorder.  You do athletic training.  How is this diagnosed? A diagnosis is made by your health care provider taking a medical history and doing a physical exam. This will include a pelvic exam to check for problems with your reproductive organs. Pregnancy must be ruled out. Often, numerous blood tests are done to measure different hormones in the body. Urine testing may be done. Specialized exams (ultrasound, CT scan, MRI, or hysteroscopy) may have to be done as well as measuring the body mass index (BMI). How is this treated? Treatment depends on the cause of the amenorrhea. If an eating disorder is present, this can be treated with an adequate diet and therapy. Chronic illnesses may improve with treatment of the illness. Amenorrhea may be corrected with medicines, lifestyle changes, or surgery. If the amenorrhea cannot be corrected, it is sometimes possible to create a  false menstruation with medicines. Follow these instructions at home:  Maintain a healthy diet.  Manage weight problems.  Exercise regularly but not excessively.  Get adequate sleep.  Manage stress.  Be aware of changes in your menstrual cycle. Keep a record of when your periods occur. Note the date your period starts, how long it lasts, and any problems. Contact a health care provider if: Your symptoms do not get better with treatment. This information is not intended to replace advice given to you by your health care provider. Make sure you discuss any questions you have with your health care provider. Document Released: 04/09/2006 Document Revised: 08/04/2015 Document Reviewed: 08/14/2012 Elsevier Interactive Patient Education  2018 Elsevier Inc.  

## 2017-09-30 NOTE — MAU Note (Signed)
Pt presents to MAU with complaints of pelvic pain and missed period for 2 months. Pt reports severe back pain last night. Last intercourse July

## 2017-10-01 ENCOUNTER — Other Ambulatory Visit: Payer: Self-pay

## 2017-10-03 ENCOUNTER — Other Ambulatory Visit: Payer: Self-pay

## 2017-10-03 DIAGNOSIS — N912 Amenorrhea, unspecified: Secondary | ICD-10-CM

## 2017-10-04 LAB — THYROID PANEL WITH TSH
Free Thyroxine Index: 1.6 (ref 1.2–4.9)
T3 Uptake Ratio: 27 % (ref 24–39)
T4, Total: 5.8 ug/dL (ref 4.5–12.0)
TSH: 1.61 u[IU]/mL (ref 0.450–4.500)

## 2017-10-04 LAB — PROLACTIN: Prolactin: 13.8 ng/mL (ref 4.8–23.3)

## 2018-07-19 ENCOUNTER — Emergency Department (HOSPITAL_COMMUNITY): Payer: BLUE CROSS/BLUE SHIELD

## 2018-07-19 ENCOUNTER — Encounter (HOSPITAL_COMMUNITY): Payer: Self-pay | Admitting: *Deleted

## 2018-07-19 ENCOUNTER — Emergency Department (HOSPITAL_COMMUNITY)
Admission: EM | Admit: 2018-07-19 | Discharge: 2018-07-19 | Disposition: A | Discharge status: Home / Self Care | Payer: BLUE CROSS/BLUE SHIELD | Attending: Emergency Medicine | Admitting: Emergency Medicine

## 2018-07-19 ENCOUNTER — Other Ambulatory Visit: Payer: Self-pay

## 2018-07-19 DIAGNOSIS — R0602 Shortness of breath: Secondary | ICD-10-CM | POA: Diagnosis not present

## 2018-07-19 DIAGNOSIS — R0789 Other chest pain: Secondary | ICD-10-CM | POA: Insufficient documentation

## 2018-07-19 DIAGNOSIS — Z87891 Personal history of nicotine dependence: Secondary | ICD-10-CM | POA: Insufficient documentation

## 2018-07-19 DIAGNOSIS — R079 Chest pain, unspecified: Secondary | ICD-10-CM

## 2018-07-19 LAB — CBC
HCT: 42 % (ref 36.0–46.0)
Hemoglobin: 14.1 g/dL (ref 12.0–15.0)
MCH: 31.7 pg (ref 26.0–34.0)
MCHC: 33.6 g/dL (ref 30.0–36.0)
MCV: 94.4 fL (ref 80.0–100.0)
Platelets: 237 10*3/uL (ref 150–400)
RBC: 4.45 MIL/uL (ref 3.87–5.11)
RDW: 12.7 % (ref 11.5–15.5)
WBC: 4.4 10*3/uL (ref 4.0–10.5)
nRBC: 0 % (ref 0.0–0.2)

## 2018-07-19 LAB — COMPREHENSIVE METABOLIC PANEL
ALT: 12 U/L (ref 0–44)
AST: 17 U/L (ref 15–41)
Albumin: 4.5 g/dL (ref 3.5–5.0)
Alkaline Phosphatase: 44 U/L (ref 38–126)
Anion gap: 9 (ref 5–15)
BUN: 12 mg/dL (ref 6–20)
CO2: 22 mmol/L (ref 22–32)
Calcium: 9.1 mg/dL (ref 8.9–10.3)
Chloride: 108 mmol/L (ref 98–111)
Creatinine, Ser: 0.78 mg/dL (ref 0.44–1.00)
GFR calc Af Amer: >60 mL/min (ref >60)
GFR calc non Af Amer: >60 mL/min (ref >60)
Glucose, Bld: 93 mg/dL (ref 70–99)
Potassium: 3.8 mmol/L (ref 3.5–5.1)
Sodium: 139 mmol/L (ref 135–145)
Total Bilirubin: 0.7 mg/dL (ref 0.3–1.2)
Total Protein: 7 g/dL (ref 6.5–8.1)

## 2018-07-19 LAB — I-STAT BETA HCG BLOOD, ED (MC, WL, AP ONLY): I-stat hCG, quantitative: <5 m[IU]/mL (ref <5)

## 2018-07-19 LAB — D-DIMER, QUANTITATIVE: D-Dimer, Quant: <0.27 ug/mL-FEU (ref 0.00–0.50)

## 2018-07-19 LAB — TROPONIN I: Troponin I: <0.03 ng/mL (ref <0.03)

## 2018-07-19 MED ORDER — SODIUM CHLORIDE 0.9% FLUSH
3.0000 mL | Freq: Once | INTRAVENOUS | Status: AC
  Administered 2018-07-19: 3 mL via INTRAVENOUS

## 2018-07-19 NOTE — Discharge Instructions (Addendum)
You have been seen today for chest pain. Please read and follow all provided instructions.  ° °1. Medications: usual home medications °2. Treatment: rest, drink plenty of fluids °3. Follow Up: Please follow up with your primary doctor in 2 days for discussion of your diagnoses and further evaluation after today's visit; if you do not have a primary care doctor use the resource guide provided to find one; Please return to the ER for any new or worsening symptoms. Please obtain all of your results from medical records or have your doctors office obtain the results - share them with your doctor - you should be seen at your doctors office. Call today to arrange your follow up.  ° °Take medications as prescribed. Please review all of the medicines and only take them if you do not have an allergy to them. Return to the emergency room for worsening condition or new concerning symptoms. Follow up with your regular doctor. If you don't have a regular doctor use one of the numbers below to establish a primary care doctor. ° °Please be aware that if you are taking birth control pills, taking other prescriptions, ESPECIALLY ANTIBIOTICS may make the birth control ineffective - if this is the case, either do not engage in sexual activity or use alternative methods of birth control such as condoms until you have finished the medicine and your family doctor says it is OK to restart them. If you are on a blood thinner such as COUMADIN, be aware that any other medicine that you take may cause the coumadin to either work too much, or not enough - you should have your coumadin level rechecked in next 7 days if this is the case.  °?  °It is also a possibility that you have an allergic reaction to any of the medicines that you have been prescribed - Everybody reacts differently to medications and while MOST people have no trouble with most medicines, you may have a reaction such as nausea, vomiting, rash, swelling, shortness of breath.  If this is the case, please stop taking the medicine immediately and contact your physician.  °?  °You should return to the ER if you develop severe or worsening symptoms.  ° °Emergency Department Resource Guide °1) Find a Doctor and Pay Out of Pocket °Although you won't have to find out who is covered by your insurance plan, it is a good idea to ask around and get recommendations. You will then need to call the office and see if the doctor you have chosen will accept you as a new patient and what types of options they offer for patients who are self-pay. Some doctors offer discounts or will set up payment plans for their patients who do not have insurance, but you will need to ask so you aren't surprised when you get to your appointment. ° °2) Contact Your Local Health Department °Not all health departments have doctors that can see patients for sick visits, but many do, so it is worth a call to see if yours does. If you don't know where your local health department is, you can check in your phone book. The CDC also has a tool to help you locate your state's health department, and many state websites also have listings of all of their local health departments. ° °3) Find a Walk-in Clinic °If your illness is not likely to be very severe or complicated, you may want to try a walk in clinic. These are popping up all over   the country in pharmacies, drugstores, and shopping centers. They're usually staffed by nurse practitioners or physician assistants that have been trained to treat common illnesses and complaints. They're usually fairly quick and inexpensive. However, if you have serious medical issues or chronic medical problems, these are probably not your best option. ° °No Primary Care Doctor: °Call [facility] at  832-8000 - they can help you locate a primary care doctor that  accepts your insurance, provides certain services, etc. °Physician Referral Service- 1-800-533-3463 ° °Chronic Pain  Problems: °Organization         Address  Phone   Notes  °[facility]  ([phone]) [phone] Patients need to be referred by their primary care doctor.  ° °Medication Assistance: °Organization         Address  Phone   Notes  °[place] [facility] [address] °[place], [place] [address] [phone]) [phone] --Must be a resident of [place] °-- Must have NO insurance coverage whatsoever (no Medicaid/ Medicare, etc.) °-- The pt. MUST have a primary care doctor that directs their care regularly and follows them in the community °  °[facility]  (866) 331-1348   °[employer]  (888) 892-1162   ° °Agencies that provide inexpensive medical care: °Organization         Address  Phone   Notes  °[facility]  ([phone]) [phone]   °[facility]    ([phone]) [phone]   °[facility] [address] °[place], [place] [address] ([phone]) [phone]   °[facility] [address], °[place] ([phone]) [phone]   °[facility]    (336) 373-0678   °[facility]    (336) 272-1050   °[facility] ° [address], [place] Phone:  (336) [phone], Fax:  ([phone]) [phone] Hours of Operation:  9 am - 6 pm, M-F.  Also accepts Medicaid/Medicare and self-pay.  °[facility] ° [address], [place] Phone: (336) [phone], Fax: ([phone]) [phone]. Hours of Operation:  8:30 am - 5:30 pm, M-F.  Also accepts Medicaid and self-pay.  °[facility] [place] [address], [place] Phone: ([phone]) [phone]   °[facility] [address], [place], [place] (336)[phone], Ext. 123 Mondays & Thursdays: 7-9 AM.  First 15 patients are seen on a first come, first serve basis. °  ° °Medicaid-accepting [place] Providers: ° °Organization         Address  Phone   Notes  °[facility] [address], [place] ([phone]) [phone] Also  accepts self-pay patients.  °[facility] [address], [place] ° ([phone]) [phone]   °[facility] [address], [place] ([phone]) [phone]   °[facility] [address] [place] [address], [place] ([phone]) [phone]   °[facility] [address], [place]  ° ([phone]) [phone] Only accepts [place] Access Medicaid patients after they have their name applied to their card.  ° °Self-Pay (no insurance) in [place]: ° °Organization         Address  Phone   Notes  °Sickle Cell Patients, [facility] [address],  ([phone]) [phone]   °[facility] [address],  ([phone]) [phone]   °[facility] ° [address], [place] ([phone]) [phone]   °[facility]/Dr. Osei-Bonsu ° [address] [place] [address], [place] or [address], [place] [phone]) [phone] Phone number for both [place] and [place] locations is the same.  °[facility] [address], [place] ([phone]) [phone]   °[facility] [address] [place] [address], [place] or [address] ([phone]) [phone] °(336) [phone]   °[facility] [address], [place] ([phone]) [phone], phone; ([phone]) [phone], fax Sees patients 1st and 3rd Saturday of every month.  Must not qualify for public or private insurance (i.e. Medicaid, Medicare, [place] Health Choice, Veterans' Benefits)  Household income should be no more than 200% of the poverty level The clinic cannot treat you if you are pregnant or think you are pregnant  Sexually transmitted diseases are not treated at the clinic.  °  ° °

## 2018-07-19 NOTE — ED Triage Notes (Signed)
States she developed chest pain "under left boob" last night, able to sleep but woke her up this morning, states she has some SHOB and stomach discomfort. Nausea 2 days ago, vomiting 8 days ago, thought she was pregnant, had light period and home test neg.

## 2018-07-19 NOTE — ED Provider Notes (Signed)
[facility]-EMERGENCY DEPT Provider Note   CSN: [id number] Arrival date & time: 07/19/18  0747    History   Chief Complaint Chief Complaint  Patient presents with   Chest Pain    HPI Linda Wallace is a 28 y.o. female with no significant past medical history presenting with intermittent non radiating left sided chest pain onset last night. Patient reports nothing makes it better or worse. Patient describes chest pain as sharp. Patient reports intermittent mild shortness of breath. Patient states she had some nausea and vomiting 8 days ago, but denies any recent nausea, vomiting, or abdominal pain. Patient states her last menstrual period was 2 days ago, but it was very light and she is concerned she may be pregnant. Patient states she had 2 negative pregnancy tests. Patient states last menstrual period prior to 2 days ago was on April 7th. Patient denies a personal history of heart problems. Patient reports father had a heart transplant at age 92. Patient reports quitting tobacco use a few years ago and denies alcohol or drug use. Patient reports traveling to [place] multiple times due to a family death. Patient denies using OCPs, recent surgery, leg edema/pain, or hx of DVT/PE. Patient denies fever, chills, cough, congestion, or sick contacts.       HPI  Past Medical History:  Diagnosis Date   Medical history non-contributory     There are no active problems to display for this patient.   Past Surgical History:  Procedure Laterality Date   INDUCED ABORTION       OB History    Gravida  2   Para      Term      Preterm      AB  2   Living        SAB  1   TAB  1   Ectopic      Multiple      Live Births               Home Medications    Prior to Admission medications   Not on File    Family History Family History  Problem Relation Age of Onset   Hypertension Other    Diabetes Other    Crohn's disease Mother    Cancer  Paternal Uncle     Social History Social History   Tobacco Use   Smoking status: Former Smoker    Packs/day: 0.50    Types: Cigarettes    Last attempt to quit: 01/11/2016    Years since quitting: 2.5   Smokeless tobacco: Never Used  Substance Use Topics   Alcohol use: Not Currently    Comment: occ   Drug use: Not Currently    Types: Marijuana     Allergies   Food   Review of Systems Review of Systems  Constitutional: Negative for activity change, appetite change, chills, diaphoresis, fatigue, fever and unexpected weight change.  HENT: Negative for congestion and rhinorrhea.   Respiratory: Negative for cough, chest tightness, shortness of breath and wheezing.   Cardiovascular: Positive for chest pain. Negative for palpitations and leg swelling.  Gastrointestinal: Negative for abdominal pain, nausea and vomiting.  Endocrine: Negative for cold intolerance and heat intolerance.  Genitourinary: Negative for dysuria and flank pain.  Musculoskeletal: Negative for back pain.  Skin: Negative for rash.  Allergic/Immunologic: Negative for immunocompromised state.  Neurological: Negative for dizziness, syncope, weakness and light-headedness.  Psychiatric/Behavioral: Negative for agitation and behavioral problems. The patient is  not nervous/anxious.    Physical Exam Updated Vital Signs BP 105/64    Pulse (!) 55    Temp 98 F (36.7 C) (Oral)    Resp 19    Ht  (1.651 m)    Wt 50.3 kg    LMP 07/17/2018    SpO2 99%    BMI 18.47 kg/m   Physical Exam Vitals signs and nursing note reviewed.  Constitutional:      General: She is not in acute distress.    Appearance: She is well-developed. She is not diaphoretic.     Comments: Patient is sitting up in bed in no acute distress.   HENT:     Head: Normocephalic and atraumatic.  Neck:     Musculoskeletal: Normal range of motion and neck supple.     Vascular: No JVD.  Cardiovascular:     Rate and Rhythm: Normal rate and regular  rhythm.     Pulses: Normal pulses.          Radial pulses are 2+ on the right side and 2+ on the left side.       Dorsalis pedis pulses are 2+ on the right side and 2+ on the left side.     Heart sounds: Normal heart sounds. No murmur. No friction rub. No gallop.   Pulmonary:     Effort: Pulmonary effort is normal. No respiratory distress.     Breath sounds: Normal breath sounds. No wheezing, rhonchi or rales.  Chest:     Chest wall: No tenderness.  Abdominal:     Palpations: Abdomen is soft.     Tenderness: There is no abdominal tenderness.  Musculoskeletal: Normal range of motion.     Right lower leg: She exhibits no tenderness. No edema.     Left lower leg: She exhibits no tenderness. No edema.  Skin:    General: Skin is warm.     Capillary Refill: Capillary refill takes less than 2 seconds.     Coloration: Skin is not pale.     Findings: No rash.  Neurological:     Mental Status: She is alert.    ED Treatments / Results  Labs (all labs ordered are listed, but only abnormal results are displayed) Labs Reviewed  CBC  TROPONIN I  COMPREHENSIVE METABOLIC PANEL  D-DIMER, QUANTITATIVE (NOT AT [facility])  I-STAT BETA HCG BLOOD, ED ([facility], [facility], [facility] ONLY)    EKG EKG Interpretation  Date/Time:  Saturday Jul 19 2018 07:55:42 EDT Ventricular Rate:  67 PR Interval:    QRS Duration: 77 QT Interval:  392 QTC Calculation: 414 R Axis:   90 Text Interpretation:  Sinus rhythm Borderline right axis deviation RSR' in V1 or V2, probably normal variant No significant change since last tracing Confirmed by Linwood Dibbles [phone]) on 07/19/2018 8:01:41 AM   Radiology Dg Chest 2 View  Result Date: 07/19/2018 CLINICAL DATA:  Chest pain, short of breath. EXAM: CHEST - 2 VIEW COMPARISON:  Radiograph 07/18/2017 FINDINGS: Normal mediastinum and cardiac silhouette. Normal pulmonary vasculature. No evidence of effusion, infiltrate, or pneumothorax. No acute bony abnormality. IMPRESSION: Normal chest radiograph.  Electronically Signed   By: Genevive Bi M.D.   On: 07/19/2018 09:52    Procedures Procedures (including critical care time)  Medications Ordered in ED Medications  sodium chloride flush (NS) 0.9 % injection 3 mL (3 mLs Intravenous Given 07/19/18 0901)     Initial Impression / Assessment and Plan / ED Course  I have reviewed the  triage vital signs and the nursing notes.  Pertinent labs & imaging results that were available during my care of the patient were reviewed by me and considered in my medical decision making (see chart for details).  Clinical Course as of Jul 19 1027  Sat Jul 19, 2018  [id number] No active cardiopulmonary disease noted on CXR.  DG Chest 2 View [AH]    Clinical Course User Index [AH] Leretha DykesHernandez, Rilley Stash P, PA-C      Patient is to be discharged with recommendation to follow up with PCP in regards to today's hospital visit. Chest pain is not likely of cardiac or pulmonary etiology d/t presentation, d dimer negative, VSS, no tracheal deviation, no JVD or new murmur, RRR, breath sounds equal bilaterally, EKG without acute abnormalities, negative troponin, and negative CXR. Pt has been advised to return to the ED if CP becomes exertional, associated with diaphoresis or nausea, radiates to left jaw/arm, worsens or becomes concerning in any way. Patient denies any symptoms currently and states symptoms have completely resolved. Pt appears reliable for follow up and is agreeable to discharge.   Final Clinical Impressions(s) / ED Diagnoses   Final diagnoses:  Nonspecific chest pain    ED Discharge Orders    None       Leretha DykesHernandez, Caileb Rhue P, [place]PA-C 07/19/18 1029    Linwood DibblesKnapp, Jon, MD 07/20/18 [id number]

## 2018-08-19 ENCOUNTER — Emergency Department (HOSPITAL_COMMUNITY): Payer: BLUE CROSS/BLUE SHIELD

## 2018-08-19 ENCOUNTER — Other Ambulatory Visit: Payer: Self-pay

## 2018-08-19 ENCOUNTER — Encounter (HOSPITAL_COMMUNITY): Payer: Self-pay

## 2018-08-19 ENCOUNTER — Emergency Department (HOSPITAL_COMMUNITY)
Admission: EM | Admit: 2018-08-19 | Discharge: 2018-08-19 | Disposition: A | Discharge status: Home / Self Care | Payer: BLUE CROSS/BLUE SHIELD | Attending: Emergency Medicine | Admitting: Emergency Medicine

## 2018-08-19 DIAGNOSIS — Z20828 Contact with and (suspected) exposure to other viral communicable diseases: Secondary | ICD-10-CM | POA: Diagnosis not present

## 2018-08-19 DIAGNOSIS — Z87891 Personal history of nicotine dependence: Secondary | ICD-10-CM | POA: Insufficient documentation

## 2018-08-19 DIAGNOSIS — J069 Acute upper respiratory infection, unspecified: Secondary | ICD-10-CM

## 2018-08-19 DIAGNOSIS — Z20822 Contact with and (suspected) exposure to covid-19: Secondary | ICD-10-CM

## 2018-08-19 DIAGNOSIS — M791 Myalgia, unspecified site: Secondary | ICD-10-CM | POA: Diagnosis present

## 2018-08-19 LAB — URINALYSIS, ROUTINE W REFLEX MICROSCOPIC
Bilirubin Urine: NEGATIVE
Glucose, UA: NEGATIVE mg/dL
Ketones, ur: NEGATIVE mg/dL
Leukocytes,Ua: NEGATIVE
Nitrite: NEGATIVE
Protein, ur: NEGATIVE mg/dL
Specific Gravity, Urine: 1.005 (ref 1.005–1.030)
pH: 7 (ref 5.0–8.0)

## 2018-08-19 LAB — PREGNANCY, URINE: Preg Test, Ur: NEGATIVE

## 2018-08-19 LAB — SARS CORONAVIRUS 2 BY RT PCR (HOSPITAL ORDER, PERFORMED IN ~~LOC~~ HOSPITAL LAB): SARS Coronavirus 2: NEGATIVE

## 2018-08-19 MED ORDER — KETOROLAC TROMETHAMINE 60 MG/2ML IM SOLN
30.0000 mg | Freq: Once | INTRAMUSCULAR | Status: AC
  Administered 2018-08-19: 30 mg via INTRAMUSCULAR
  Filled 2018-08-19: qty 2

## 2018-08-19 NOTE — ED Triage Notes (Addendum)
Patient states she "hung out with friends 2 nights ago and woke up yesterday with chills and body aches that progressed throughout the day with some nausea and hot flashes.   Patient also states she had intermittent chest pressure a week ago with pain in "right boob radiating to right side".   Patient states she has had a dry cough for 3 days now.  Patient states she had some shob yesterday around 4:00 pm.   Patient recently got a new tattoo that she was worried about.   Denies sore throat.   Patient is able to speak in complete sentences with no problems.   A/Ox4 Ambulatory in triage.

## 2018-08-19 NOTE — ED Notes (Signed)
[place], MD at bedside.  

## 2018-08-19 NOTE — ED Provider Notes (Signed)
[facility]-EMERGENCY DEPT Provider Note   CSN: [id number] Arrival date & time: 08/19/18  1249    History   Chief Complaint Chief Complaint  Patient presents with  . Chills  . Generalized Body Aches    HPI Linda Wallace is a 28 y.o. female.     Pt presents to the ED today with body aches and chills.  The pt was exposed to a friend's dad who has since tested + for Covid.  The friend has been tested, but those results are not yet back.  She has had a cough.  No other urinary sx.  She has also recently gotten a tattoo and was concerned about that.      Past Medical History:  Diagnosis Date  . Medical history non-contributory     There are no active problems to display for this patient.   Past Surgical History:  Procedure Laterality Date  . INDUCED ABORTION       OB History    Gravida  2   Para      Term      Preterm      AB  2   Living        SAB  1   TAB  1   Ectopic      Multiple      Live Births               Home Medications    Prior to Admission medications   Not on File    Family History Family History  Problem Relation Age of Onset  . Hypertension Other   . Diabetes Other   . Crohn's disease Mother   . Cancer Paternal Uncle     Social History Social History   Tobacco Use  . Smoking status: Former Smoker    Packs/day: 0.50    Types: Cigarettes    Last attempt to quit: 01/11/2016    Years since quitting: 2.6  . Smokeless tobacco: Never Used  Substance Use Topics  . Alcohol use: Not Currently    Comment: occ  . Drug use: Not Currently    Types: Marijuana     Allergies   Food   Review of Systems Review of Systems  Constitutional: Positive for chills and fever.  Respiratory: Positive for cough.   All other systems reviewed and are negative.    Physical Exam Updated Vital Signs BP (!) 135/93 (BP Location: Left Arm)   Pulse 97   Temp 99.5 F (37.5 C) (Oral)   Resp 17   LMP  08/12/2018   SpO2 99%   Physical Exam Vitals signs and nursing note reviewed.  Constitutional:      Appearance: Normal appearance.  HENT:     Head: Normocephalic and atraumatic.     Right Ear: External ear normal.     Left Ear: External ear normal.     Nose: Nose normal.     Mouth/Throat:     Mouth: Mucous membranes are moist.     Pharynx: Oropharynx is clear.  Eyes:     Extraocular Movements: Extraocular movements intact.     Conjunctiva/sclera: Conjunctivae normal.     Pupils: Pupils are equal, round, and reactive to light.  Neck:     Musculoskeletal: Normal range of motion and neck supple.  Cardiovascular:     Rate and Rhythm: Normal rate and regular rhythm.     Pulses: Normal pulses.     Heart sounds: Normal heart sounds.  Pulmonary:     Effort: Pulmonary effort is normal.     Breath sounds: Normal breath sounds.  Abdominal:     General: Abdomen is flat. Bowel sounds are normal.     Palpations: Abdomen is soft.  Musculoskeletal: Normal range of motion.  Skin:    General: Skin is warm.     Capillary Refill: Capillary refill takes less than 2 seconds.     Comments: Tattoo to LLE is not infected  Neurological:     General: No focal deficit present.     Mental Status: She is alert and oriented to person, place, and time.  Psychiatric:        Mood and Affect: Mood normal.        Behavior: Behavior normal.        Thought Content: Thought content normal.        Judgment: Judgment normal.      ED Treatments / Results  Labs (all labs ordered are listed, but only abnormal results are displayed) Labs Reviewed  URINALYSIS, ROUTINE W REFLEX MICROSCOPIC - Abnormal; Notable for the following components:      Result Value   Color, Urine STRAW (*)    Hgb urine dipstick MODERATE (*)    Bacteria, UA RARE (*)    All other components within normal limits  SARS CORONAVIRUS 2 (HOSPITAL ORDER, [place] [facility])  PREGNANCY, URINE    EKG None  Radiology  Dg Chest Portable 1 View  Result Date: 08/19/2018 CLINICAL DATA:  Cough, shortness of breath, chills and body aches for 3 days. EXAM: PORTABLE CHEST 1 VIEW COMPARISON:  PA and lateral chest 07/19/2018 and 07/18/2017. FINDINGS: The lungs are clear. Heart size is normal. No pneumothorax or pleural effusion. No acute or focal bony abnormality. IMPRESSION: Normal chest. Electronically Signed   By: Inge Rise M.D.   On: 08/19/2018 14:01    Procedures Procedures (including critical care time)  Medications Ordered in ED Medications  ketorolac (TORADOL) injection 30 mg (30 mg Intramuscular Given 08/19/18 1323)     Initial Impression / Assessment and Plan / ED Course  I have reviewed the triage vital signs and the nursing notes.  Pertinent labs & imaging results that were available during my care of the patient were reviewed by me and considered in my medical decision making (see chart for details).        Pt is feeling better after toradol.  She tested negative for Covid.  The tattoo looks good and has no drainage.  She is instructed to alternate tylenol and ibuprofen.  Return if worse.  F/u with pcp.  Final Clinical Impressions(s) / ED Diagnoses   Final diagnoses:  Viral upper respiratory tract infection  Covid-19 Virus not Detected    ED Discharge Orders    None       Isla Pence, MD 08/19/18 1437

## 2018-08-28 ENCOUNTER — Encounter: Payer: Self-pay | Admitting: Family Medicine

## 2018-08-28 ENCOUNTER — Other Ambulatory Visit (HOSPITAL_COMMUNITY)
Admission: RE | Admit: 2018-08-28 | Discharge: 2018-08-28 | Disposition: A | Discharge status: Home / Self Care | Payer: BLUE CROSS/BLUE SHIELD | Source: Ambulatory Visit | Attending: Family Medicine | Admitting: Family Medicine

## 2018-08-28 ENCOUNTER — Ambulatory Visit (INDEPENDENT_AMBULATORY_CARE_PROVIDER_SITE_OTHER): Payer: BLUE CROSS/BLUE SHIELD | Admitting: Family Medicine

## 2018-08-28 VITALS — BP 100/70 | HR 81 | Temp 98.0°F | Ht 65.0 in | Wt 105.8 lb

## 2018-08-28 DIAGNOSIS — Z0000 Encounter for general adult medical examination without abnormal findings: Secondary | ICD-10-CM | POA: Diagnosis not present

## 2018-08-28 DIAGNOSIS — Z8349 Family history of other endocrine, nutritional and metabolic diseases: Secondary | ICD-10-CM

## 2018-08-28 DIAGNOSIS — Z124 Encounter for screening for malignant neoplasm of cervix: Secondary | ICD-10-CM

## 2018-08-28 LAB — LIPID PANEL
Cholesterol: 116 mg/dL (ref 0–200)
HDL: 50.5 mg/dL (ref >39.00)
LDL Cholesterol: 52 mg/dL (ref 0–99)
NonHDL: 65.58
Total CHOL/HDL Ratio: 2
Triglycerides: 68 mg/dL (ref 0.0–149.0)
VLDL: 13.6 mg/dL (ref 0.0–40.0)

## 2018-08-28 LAB — BASIC METABOLIC PANEL
BUN: 10 mg/dL (ref 6–23)
CO2: 25 mEq/L (ref 19–32)
Calcium: 9.3 mg/dL (ref 8.4–10.5)
Chloride: 105 mEq/L (ref 96–112)
Creatinine, Ser: 0.74 mg/dL (ref 0.40–1.20)
GFR: 95.05 mL/min (ref >60.00)
Glucose, Bld: 88 mg/dL (ref 70–99)
Potassium: 4.3 mEq/L (ref 3.5–5.1)
Sodium: 139 mEq/L (ref 135–145)

## 2018-08-28 LAB — AST: AST: 13 U/L (ref 0–37)

## 2018-08-28 LAB — ALT: ALT: 13 U/L (ref 0–35)

## 2018-08-28 LAB — TSH: TSH: 1.7 u[IU]/mL (ref 0.35–4.50)

## 2018-08-28 NOTE — Patient Instructions (Signed)

## 2018-08-28 NOTE — Progress Notes (Addendum)
Linda Wallace is a 28 y.o. female  Chief Complaint  Patient presents with  . Establish Care    est care/ CPE/ Fasting/ last pap 3 years/ wants a pap    HPI: Linda Wallace is a 28 y.o. female here to establish care with our office and for a CPE, fasting labs. She is due for PAP and is agreeable to doing this today. Periods are mostly regular, had a missed period about 1 year ago. Follows with GYN Dr. Norman Herrlich.  Pt has no concerns or complaints today.   Last PAP: due  Med refills needed today? n/a   Past Medical History:  Diagnosis Date  . Allergy   . Depression   . Eating disorder   . Medical history non-contributory     Past Surgical History:  Procedure Laterality Date  . INDUCED ABORTION    . INDUCED ABORTION  2016    Social History   Socioeconomic History  . Marital status: Single    Spouse name: Not on file  . Number of children: Not on file  . Years of education: Not on file  . Highest education level: Not on file  Occupational History  . Not on file  Social Needs  . Financial resource strain: Not on file  . Food insecurity    Worry: Not on file    Inability: Not on file  . Transportation needs    Medical: Not on file    Non-medical: Not on file  Tobacco Use  . Smoking status: Former Smoker    Packs/day: 0.50    Types: Cigarettes    Quit date: 01/11/2016    Years since quitting: 2.6  . Smokeless tobacco: Never Used  Substance and Sexual Activity  . Alcohol use: Not Currently    Comment: occ  . Drug use: Not Currently    Types: Marijuana  . Sexual activity: Yes    Birth control/protection: None  Lifestyle  . Physical activity    Days per week: Not on file    Minutes per session: Not on file  . Stress: Not on file  Relationships  . Social Herbalist on phone: Not on file    Gets together: Not on file    Attends religious service: Not on file    Active member of club or organization: Not on file    Attends meetings of clubs or  organizations: Not on file    Relationship status: Not on file  . Intimate partner violence    Fear of current or ex partner: Not on file    Emotionally abused: Not on file    Physically abused: Not on file    Forced sexual activity: Not on file  Other Topics Concern  . Not on file  Social History Narrative  . Not on file    Family History  Problem Relation Age of Onset  . Hypertension Other   . Diabetes Other   . Crohn's disease Mother   . Cancer Paternal Uncle   . Alcohol abuse Father   . Arthritis Father   . Depression Father   . Drug abuse Father   . Early death Father   . Hearing loss Father   . Heart disease Father   . Hyperlipidemia Father   . Hypertension Father   . Mental illness Father      Immunization History  Administered Date(s) Administered  . Tdap 06/04/2012    No outpatient encounter medications on file as of  08/28/2018.   No facility-administered encounter medications on file as of 08/28/2018.      ROS: Gen: no fever, chills  Skin: no rash, itching ENT: no ear pain, ear drainage, nasal congestion, rhinorrhea, sinus pressure, sore throat Eyes: no blurry vision, double vision Resp: no cough, wheeze,SOB Breast: no breast tenderness, no nipple discharge, no breast masses CV: no CP, palpitations, LE edema,  GI: no heartburn, n/v/d/c, abd pain GU: no dysuria, urgency, frequency, hematuria; no vaginal itching, odor, discharge MSK: no joint pain, myalgias, back pain Neuro: no dizziness, headache, weakness, vertigo Psych: no depression, anxiety, insomnia   Allergies  Allergen Reactions  . Food Swelling    PATIENT IS ALLERGIC TO ALL FRUIT---HIVES/TONGUE SWELL/THROAT CLOSES    BP 100/70   Pulse 81   Temp 98 F (36.7 C) (Oral)   Ht 5\' 5"  (1.651 m)   Wt 105 lb 12.8 oz (48 kg)   LMP 08/12/2018   SpO2 97%   BMI 17.61 kg/m   Physical Exam  Constitutional: She is oriented to person, place, and time. She appears well-developed and  well-nourished. No distress.  HENT:  Head: Normocephalic and atraumatic.  Right Ear: Tympanic membrane and ear canal normal.  Left Ear: Tympanic membrane and ear canal normal.  Nose: Nose normal.  Mouth/Throat: Oropharynx is clear and moist and mucous membranes are normal.  Eyes: Pupils are equal, round, and reactive to light. Conjunctivae are normal.  Neck: Normal range of motion. No thyromegaly present.  Cardiovascular: Normal rate, regular rhythm, normal heart sounds and intact distal pulses.  No murmur heard. Pulmonary/Chest: Effort normal and breath sounds normal. No respiratory distress. She has no wheezes. Right breast exhibits no mass, no skin change and no tenderness. Left breast exhibits no mass, no skin change and no tenderness.  Abdominal: Soft. Bowel sounds are normal. She exhibits no distension and no mass. There is no abdominal tenderness.  Genitourinary:    Vagina and uterus normal.  There is no rash, tenderness or lesion on the right labia. There is no rash, tenderness or lesion on the left labia. Cervix exhibits no motion tenderness and no discharge.    No vaginal discharge.   Musculoskeletal:        General: No edema.  Lymphadenopathy:    She has no cervical adenopathy.  Neurological: She is alert and oriented to person, place, and time. She exhibits normal muscle tone. Coordination normal.  Skin: Skin is warm and dry.  Psychiatric: She has a normal mood and affect. Her behavior is normal.     A/P:  1. Annual physical exam - PAP and CBE today - immunizations UTD - cont with regular exercise and overall healthy diet - pt has a documented h/o eating disorder although declines to discuss this today - ALT - AST - Basic metabolic panel - Lipid panel - next CPE in 1 year  2. Screening for cervical cancer - Cytology - PAP( [place])  3. Family history of thyroid disorder - maternal aunt with thyroid disorder (pt unsure of what specific type of thyroid issue) -  TSH

## 2018-08-29 LAB — CYTOLOGY - PAP
Adequacy: ABSENT
Chlamydia: NEGATIVE
Diagnosis: NEGATIVE
Neisseria Gonorrhea: NEGATIVE

## 2018-09-06 ENCOUNTER — Encounter (HOSPITAL_COMMUNITY): Payer: Self-pay | Admitting: Emergency Medicine

## 2018-09-06 ENCOUNTER — Emergency Department (HOSPITAL_COMMUNITY)
Admission: EM | Admit: 2018-09-06 | Discharge: 2018-09-07 | Disposition: A | Discharge status: Home / Self Care | Payer: BLUE CROSS/BLUE SHIELD | Attending: Emergency Medicine | Admitting: Emergency Medicine

## 2018-09-06 ENCOUNTER — Other Ambulatory Visit: Payer: Self-pay

## 2018-09-06 DIAGNOSIS — R112 Nausea with vomiting, unspecified: Secondary | ICD-10-CM | POA: Diagnosis not present

## 2018-09-06 DIAGNOSIS — R1030 Lower abdominal pain, unspecified: Secondary | ICD-10-CM | POA: Insufficient documentation

## 2018-09-06 DIAGNOSIS — Z87891 Personal history of nicotine dependence: Secondary | ICD-10-CM | POA: Insufficient documentation

## 2018-09-06 DIAGNOSIS — N898 Other specified noninflammatory disorders of vagina: Secondary | ICD-10-CM | POA: Diagnosis not present

## 2018-09-06 DIAGNOSIS — R197 Diarrhea, unspecified: Secondary | ICD-10-CM | POA: Diagnosis not present

## 2018-09-06 DIAGNOSIS — R509 Fever, unspecified: Secondary | ICD-10-CM | POA: Insufficient documentation

## 2018-09-06 DIAGNOSIS — R1031 Right lower quadrant pain: Secondary | ICD-10-CM | POA: Diagnosis present

## 2018-09-06 DIAGNOSIS — R05 Cough: Secondary | ICD-10-CM | POA: Diagnosis not present

## 2018-09-06 DIAGNOSIS — R1032 Left lower quadrant pain: Secondary | ICD-10-CM | POA: Diagnosis not present

## 2018-09-06 MED ORDER — SODIUM CHLORIDE 0.9% FLUSH: 3.0000 mL | Freq: Once | INTRAVENOUS | Status: DC

## 2018-09-06 MED ORDER — ONDANSETRON 4 MG PO TBDP
4.0000 mg | ORAL_TABLET | Freq: Once | ORAL | Status: AC | PRN
  Administered 2018-09-07: 4 mg via ORAL
  Filled 2018-09-06: qty 1

## 2018-09-06 NOTE — ED Triage Notes (Signed)
Patient complaining of left upper abdominal pain for 2 weeks. Patient also is complaining of fever, nausea, and dizziness.

## 2018-09-06 NOTE — ED Notes (Signed)
Bed: WA07 Expected date:  Expected time:  Means of arrival:  Comments: 

## 2018-09-07 LAB — URINALYSIS, ROUTINE W REFLEX MICROSCOPIC
Bilirubin Urine: NEGATIVE
Glucose, UA: NEGATIVE mg/dL
Hgb urine dipstick: NEGATIVE
Ketones, ur: NEGATIVE mg/dL
Leukocytes,Ua: NEGATIVE
Nitrite: NEGATIVE
Protein, ur: NEGATIVE mg/dL
Specific Gravity, Urine: 1.016 (ref 1.005–1.030)
pH: 7 (ref 5.0–8.0)

## 2018-09-07 LAB — COMPREHENSIVE METABOLIC PANEL
ALT: 16 U/L (ref 0–44)
AST: 16 U/L (ref 15–41)
Albumin: 4.5 g/dL (ref 3.5–5.0)
Alkaline Phosphatase: 42 U/L (ref 38–126)
Anion gap: 10 (ref 5–15)
BUN: 13 mg/dL (ref 6–20)
CO2: 22 mmol/L (ref 22–32)
Calcium: 9.2 mg/dL (ref 8.9–10.3)
Chloride: 106 mmol/L (ref 98–111)
Creatinine, Ser: 0.68 mg/dL (ref 0.44–1.00)
GFR calc Af Amer: >60 mL/min (ref >60)
GFR calc non Af Amer: >60 mL/min (ref >60)
Glucose, Bld: 100 mg/dL — ABNORMAL HIGH (ref 70–99)
Potassium: 3.8 mmol/L (ref 3.5–5.1)
Sodium: 138 mmol/L (ref 135–145)
Total Bilirubin: 0.2 mg/dL — ABNORMAL LOW (ref 0.3–1.2)
Total Protein: 7.4 g/dL (ref 6.5–8.1)

## 2018-09-07 LAB — LIPASE, BLOOD: Lipase: 42 U/L (ref 11–51)

## 2018-09-07 LAB — CBC
HCT: 44.7 % (ref 36.0–46.0)
Hemoglobin: 14.9 g/dL (ref 12.0–15.0)
MCH: 30.9 pg (ref 26.0–34.0)
MCHC: 33.3 g/dL (ref 30.0–36.0)
MCV: 92.7 fL (ref 80.0–100.0)
Platelets: 239 10*3/uL (ref 150–400)
RBC: 4.82 MIL/uL (ref 3.87–5.11)
RDW: 11.9 % (ref 11.5–15.5)
WBC: 6.9 10*3/uL (ref 4.0–10.5)
nRBC: 0 % (ref 0.0–0.2)

## 2018-09-07 LAB — I-STAT BETA HCG BLOOD, ED (MC, WL, AP ONLY): I-stat hCG, quantitative: <5 m[IU]/mL (ref <5)

## 2018-09-07 NOTE — ED Provider Notes (Signed)
[facility] DEPT Provider Note   CSN: [id number] Arrival date & time: 09/06/18  2305     History   Chief Complaint Chief Complaint  Patient presents with  . Abdominal Pain    HPI Linda Wallace is a 28 y.o. female.     The history is provided by the patient.  Abdominal Pain Pain location:  LLQ and RLQ Pain quality: aching   Pain severity:  Moderate Onset quality:  Gradual Duration:  2 weeks Timing:  Intermittent Progression:  Worsening Chronicity:  New Relieved by:  Nothing Worsened by:  Movement and palpation Associated symptoms: cough, diarrhea, fever, nausea, sore throat, vaginal discharge and vomiting   Associated symptoms: no chest pain, no dysuria, no hematemesis, no hematochezia and no vaginal bleeding   Patient presents for multiple complaints.  She reports she has had intermittent abdominal pain for at least 2 weeks.  She reports fever earlier in the day at 102.7.  She also reports nonbloody emesis and diarrhea.  She also reports recent cough and sore throat. She has been in contact with her PCP who advised ER evaluation. She reports she has been tested twice for COVID-19 and is negative She does not want further testing Past Medical History:  Diagnosis Date  . Allergy   . Depression   . Eating disorder   . Medical history non-contributory     There are no active problems to display for this patient.   Past Surgical History:  Procedure Laterality Date  . INDUCED ABORTION    . INDUCED ABORTION  2016     OB History    Gravida  2   Para      Term      Preterm      AB  2   Living        SAB  1   TAB  1   Ectopic      Multiple      Live Births               Home Medications    Prior to Admission medications   Not on File    Family History Family History  Problem Relation Age of Onset  . Hypertension Other   . Diabetes Other   . Crohn's disease Mother   . Cancer Paternal Uncle   .  Alcohol abuse Father   . Arthritis Father   . Depression Father   . Drug abuse Father   . Early death Father   . Hearing loss Father   . Heart disease Father   . Hyperlipidemia Father   . Hypertension Father   . Mental illness Father     Social History Social History   Tobacco Use  . Smoking status: Former Smoker    Packs/day: 0.50    Types: Cigarettes    Quit date: 01/11/2016    Years since quitting: 2.6  . Smokeless tobacco: Never Used  Substance Use Topics  . Alcohol use: Not Currently    Comment: occ  . Drug use: Not Currently    Types: Marijuana     Allergies   Food   Review of Systems Review of Systems  Constitutional: Positive for fever.  HENT: Positive for sore throat.   Respiratory: Positive for cough.   Cardiovascular: Negative for chest pain.  Gastrointestinal: Positive for abdominal pain, diarrhea, nausea and vomiting. Negative for hematemesis and hematochezia.  Genitourinary: Positive for vaginal discharge. Negative for dysuria and vaginal bleeding.  Neurological:  Positive for dizziness.  All other systems reviewed and are negative.    Physical Exam Updated Vital Signs BP 122/67 (BP Location: Right Arm)   Pulse 84   Temp 98.7 F (37.1 C) (Oral)   Resp 18   Ht 1.651 m (5\' 5" )   Wt 48.3 kg   LMP 08/12/2018   SpO2 100%   BMI 17.72 kg/m   Physical Exam  CONSTITUTIONAL: Well developed/well nourished HEAD: Normocephalic/atraumatic EYES: EOMI ENMT: Mucous membranes moist NECK: supple no meningeal signs SPINE/BACK:entire spine nontender CV: S1/S2 noted, no murmurs/rubs/gallops noted LUNGS: Lungs are clear to auscultation bilaterally, no apparent distress ABDOMEN: soft, mild LLQ/RLQ tenderness, no rebound or guarding, bowel sounds noted throughout abdomen GU:no cva tenderness NEURO: Pt is awake/alert/appropriate, moves all extremitiesx4.  No facial droop.   EXTREMITIES: pulses normal/equal, full ROM SKIN: warm, color normal PSYCH:  Mildly  anxious  ED Treatments / Results  Labs (all labs ordered are listed, but only abnormal results are displayed) Labs Reviewed  COMPREHENSIVE METABOLIC PANEL - Abnormal; Notable for the following components:      Result Value   Glucose, Bld 100 (*)    Total Bilirubin 0.2 (*)    All other components within normal limits  LIPASE, BLOOD  CBC  URINALYSIS, ROUTINE W REFLEX MICROSCOPIC  I-STAT BETA HCG BLOOD, ED ([facility], [facility], [facility] ONLY)    EKG    Radiology No results found.  Procedures Procedures   Medications Ordered in ED Medications  sodium chloride flush (NS) 0.9 % injection 3 mL (0 mLs Intravenous Hold 09/07/18 0003)  ondansetron (ZOFRAN-ODT) disintegrating tablet 4 mg (4 mg Oral Given 09/07/18 0053)     Initial Impression / Assessment and Plan / ED Course  I have reviewed the triage vital signs and the nursing notes.  Pertinent labs  results that were available during my care of the patient were reviewed by me and considered in my medical decision making (see chart for details).        Patient reports multiple complaints, including abdominal pain for 2 weeks with vomiting/diarrhea. Vital signs here are appropriate, she is afebrile.  Labs are reassuring. Due to persistent abdominal pain I offered CT imaging. After discussion of risks and benefit, patient declines and would like to be discharged home.  She plans to follow with her PCP in the  next 48 hours and will receive an outpatient CT scan At this point I feel this plan is reasonable as she is otherwise in no acute distress Plan is for her to call PCP in 48 hours for an outpatient follow-up CT scan.  We discussed strict ER return precautions  Final Clinical Impressions(s) / ED Diagnoses   Final diagnoses:  Lower abdominal pain    ED Discharge Orders    None       Zadie RhineWickline, Minette Manders, MD 09/07/18 [phone]

## 2018-09-07 NOTE — Discharge Instructions (Signed)

## 2018-09-11 ENCOUNTER — Telehealth: Payer: Self-pay | Admitting: Family Medicine

## 2018-09-11 DIAGNOSIS — R1084 Generalized abdominal pain: Secondary | ICD-10-CM

## 2018-09-11 DIAGNOSIS — R1030 Lower abdominal pain, unspecified: Secondary | ICD-10-CM

## 2018-09-11 NOTE — Telephone Encounter (Signed)
Pt went to the ED on 09/05/2018 with c/ o abd pain, ED advise to get CT done for more evaluation but pt didn't want to get it done in the hospital. Pt was wondering if Dr. Loletha Grayer can order a CAT scan outside hospital or what to do. Please see note and please advise.

## 2018-09-16 ENCOUNTER — Other Ambulatory Visit: Payer: Self-pay

## 2018-09-16 ENCOUNTER — Emergency Department (HOSPITAL_BASED_OUTPATIENT_CLINIC_OR_DEPARTMENT_OTHER)
Admission: EM | Admit: 2018-09-16 | Discharge: 2018-09-16 | Disposition: A | Discharge status: Home / Self Care | Payer: BLUE CROSS/BLUE SHIELD | Attending: Emergency Medicine | Admitting: Emergency Medicine

## 2018-09-16 ENCOUNTER — Encounter (HOSPITAL_BASED_OUTPATIENT_CLINIC_OR_DEPARTMENT_OTHER): Payer: Self-pay

## 2018-09-16 ENCOUNTER — Emergency Department (HOSPITAL_BASED_OUTPATIENT_CLINIC_OR_DEPARTMENT_OTHER): Payer: BLUE CROSS/BLUE SHIELD

## 2018-09-16 DIAGNOSIS — F419 Anxiety disorder, unspecified: Secondary | ICD-10-CM | POA: Insufficient documentation

## 2018-09-16 DIAGNOSIS — Y9389 Activity, other specified: Secondary | ICD-10-CM | POA: Diagnosis not present

## 2018-09-16 DIAGNOSIS — Z87891 Personal history of nicotine dependence: Secondary | ICD-10-CM | POA: Insufficient documentation

## 2018-09-16 DIAGNOSIS — Y999 Unspecified external cause status: Secondary | ICD-10-CM | POA: Insufficient documentation

## 2018-09-16 DIAGNOSIS — W1789XA Other fall from one level to another, initial encounter: Secondary | ICD-10-CM | POA: Diagnosis not present

## 2018-09-16 DIAGNOSIS — S0990XA Unspecified injury of head, initial encounter: Secondary | ICD-10-CM | POA: Diagnosis not present

## 2018-09-16 DIAGNOSIS — Y92414 Local residential or business street as the place of occurrence of the external cause: Secondary | ICD-10-CM | POA: Insufficient documentation

## 2018-09-16 LAB — PREGNANCY, URINE: Preg Test, Ur: NEGATIVE

## 2018-09-16 MED ORDER — ACETAMINOPHEN 500 MG PO TABS
1000.0000 mg | ORAL_TABLET | Freq: Once | ORAL | Status: AC
  Administered 2018-09-16: 1000 mg via ORAL
  Filled 2018-09-16: qty 2

## 2018-09-16 NOTE — ED Triage Notes (Signed)
Pt states she fell from long board ~7 hours PTA-pain to to left UE, left knee and left side of head-denies LOC-NAD-steady gait

## 2018-09-16 NOTE — Telephone Encounter (Signed)
ED note reviewed. Order placed for CT and will contact pt once I have result

## 2018-09-16 NOTE — ED Provider Notes (Signed)
[facility] EMERGENCY DEPARTMENT Provider Note   CSN: [id number] Arrival date & time: 09/16/18  2232     History   Chief Complaint Chief Complaint  Patient presents with  . Trauma    HPI Linda Wallace is a 28 y.o. female.     The history is provided by the patient.  Trauma Mechanism of injury: fall Injury location: head/neck Injury location detail: head Incident location: in the street Time since incident: 12 hours Arrived directly from scene: no   Fall:      Fall occurred: long board to ground.      Height of fall: from standing      Impact surface: concrete      Point of impact: head      Entrapped after fall: no  Protective equipment:       No boots, flotation device or protective suit.       Suspicion of alcohol use: no      Suspicion of drug use: no  EMS/PTA data:      Bystander interventions: none      Ambulatory at scene: yes      Blood loss: none      Responsiveness: alert      Oriented to: person, place, situation and time      Loss of consciousness: no      Amnesic to event: no      Airway interventions: none      Breathing interventions: none      IV access: none      IO access: none      Fluids administered: none      Cardiac interventions: none      Medications administered: none      Immobilization: none      Airway condition since incident: stable      Breathing condition since incident: stable      Circulation condition since incident: stable      Mental status condition since incident: stable      Disability condition since incident: stable  Current symptoms:      Pain quality: aching      Pain timing: constant      Associated symptoms:            Denies abdominal pain, back pain, blindness, chest pain, difficulty breathing, headache, hearing loss, loss of consciousness, neck pain and seizures.   Relevant PMH:      Medical risk factors:            No asthma or hemophilia.       Pharmacological risk factors:   No anticoagulation therapy.       Tetanus status: UTD      The patient has not been admitted to the hospital due to injury in the past year, and has not been treated and released from the ED due to injury in the past year.   Past Medical History:  Diagnosis Date  . Allergy   . Depression   . Eating disorder   . Medical history non-contributory     There are no active problems to display for this patient.   Past Surgical History:  Procedure Laterality Date  . INDUCED ABORTION    . INDUCED ABORTION  2016     OB History    Gravida  2   Para      Term      Preterm      AB  2   Living  SAB  1   TAB  1   Ectopic      Multiple      Live Births               Home Medications    Prior to Admission medications   Not on File    Family History Family History  Problem Relation Age of Onset  . Hypertension Other   . Diabetes Other   . Crohn's disease Mother   . Cancer Paternal Uncle   . Alcohol abuse Father   . Arthritis Father   . Depression Father   . Drug abuse Father   . Early death Father   . Hearing loss Father   . Heart disease Father   . Hyperlipidemia Father   . Hypertension Father   . Mental illness Father     Social History Social History   Tobacco Use  . Smoking status: Former Smoker    Packs/day: 0.50    Types: Cigarettes    Quit date: 01/11/2016    Years since quitting: 2.6  . Smokeless tobacco: Never Used  Substance Use Topics  . Alcohol use: Not Currently    Comment: =  . Drug use: Not Currently     Allergies   Food   Review of Systems Review of Systems  HENT: Negative for hearing loss.   Eyes: Negative for blindness and visual disturbance.  Cardiovascular: Negative for chest pain.  Gastrointestinal: Negative for abdominal pain.  Musculoskeletal: Negative for back pain, neck pain and neck stiffness.  Neurological: Negative for dizziness, seizures, loss of consciousness, syncope, facial asymmetry, speech  difficulty, weakness, light-headedness and headaches.  All other systems reviewed and are negative.    Physical Exam Updated Vital Signs BP 117/78 (BP Location: Left Arm)   Pulse 74   Temp 99.1 F (37.3 C) (Oral)   Resp 18   Ht 5\' 5"  (1.651 m)   Wt 49 kg   LMP 09/12/2018   SpO2 100%   BMI 17.97 kg/m   Physical Exam Vitals signs and nursing note reviewed.  Constitutional:      General: She is not in acute distress.    Appearance: She is normal weight.  HENT:     Head: Normocephalic and atraumatic.     Right Ear: Tympanic membrane normal.     Left Ear: Tympanic membrane normal.     Nose: Nose normal.  Eyes:     Extraocular Movements: Extraocular movements intact.     Conjunctiva/sclera: Conjunctivae normal.     Pupils: Pupils are equal, round, and reactive to light.  Neck:     Musculoskeletal: Normal range of motion and neck supple.  Cardiovascular:     Rate and Rhythm: Normal rate and regular rhythm.     Pulses: Normal pulses.     Heart sounds: Normal heart sounds.  Pulmonary:     Effort: Pulmonary effort is normal.     Breath sounds: Normal breath sounds.  Abdominal:     General: Abdomen is flat. Bowel sounds are normal.     Tenderness: There is no abdominal tenderness.  Musculoskeletal: Normal range of motion.     Cervical back: Normal.  Skin:    General: Skin is warm and dry.     Capillary Refill: Capillary refill takes 2 to 3 seconds.  Neurological:     General: No focal deficit present.     Mental Status: She is alert and oriented to person, place, and time.  Cranial Nerves: No cranial nerve deficit.     Gait: Gait normal.     Deep Tendon Reflexes: Reflexes normal.  Psychiatric:        Mood and Affect: Mood is anxious. Affect is tearful.      ED Treatments / Results  Labs (all labs ordered are listed, but only abnormal results are displayed) Labs Reviewed  PREGNANCY, URINE    EKG None  Radiology Ct Head Wo Contrast  Result Date:  09/16/2018 CLINICAL DATA:  Fall from long board. Left head pain. EXAM: CT HEAD WITHOUT CONTRAST TECHNIQUE: Contiguous axial images were obtained from the base of the skull through the vertex without intravenous contrast. COMPARISON:  None. FINDINGS: Brain: Incidental note of cavum septum pellucidum, normal variant of no clinical significance. No intracranial hemorrhage, mass effect, or midline shift. No hydrocephalus. The basilar cisterns are patent. No evidence of territorial infarct or acute ischemia. No extra-axial or intracranial fluid collection. Vascular: No hyperdense vessel. Skull: No fracture or focal lesion. Sinuses/Orbits: Paranasal sinuses and mastoid air cells are clear. The visualized orbits are unremarkable. Other: None. IMPRESSION: Negative head CT. Electronically Signed   By: Narda RutherfordMelanie  Sanford M.D.   On: 09/16/2018 23:43    Procedures Procedures (including critical care time)  Medications Ordered in ED Medications  acetaminophen (TYLENOL) tablet 1,000 mg (1,000 mg Oral Given 09/16/18 2333)      Final Clinical Impressions(s) / ED Diagnoses   Final diagnoses:  Minor head injury, initial encounter  Anxiety    Return for intractable cough, coughing up blood,fevers >100.4 unrelieved by medication, shortness of breath, intractable vomiting, chest pain, shortness of breath, weakness,numbness, changes in speech, facial asymmetry,abdominal pain, passing out,Inability to tolerate liquids or food, cough, altered mental status or any concerns. No signs of systemic illness or infection. The patient is nontoxic-appearing on exam and vital signs are within normal limits.   I have reviewed the triage vital signs and the nursing notes. Pertinent labs &imaging results that were available during my care of the patient were reviewed by me and considered in my medical decision making (see chart for details).  After history, exam, and medical workup I feel the patient has been  appropriately medically screened and is safe for discharge home. Pertinent diagnoses were discussed with the patient. Patient was given return precautions   Ethelene Closser, MD 09/16/18 2354

## 2018-09-16 NOTE — Telephone Encounter (Signed)
Sent mychart message letting pt know referral has been made.

## 2018-09-16 NOTE — ED Notes (Signed)
ED Provider at bedside. 

## 2018-09-16 NOTE — ED Notes (Addendum)
Pt reports she was not wearing a helmet at the time of fall. No deformities noted. Denies LOC, ambulatory at a steady gait.

## 2018-10-04 ENCOUNTER — Encounter: Payer: Self-pay | Admitting: Family Medicine

## 2018-10-07 ENCOUNTER — Other Ambulatory Visit: Payer: Self-pay

## 2018-10-07 ENCOUNTER — Ambulatory Visit
Admission: RE | Admit: 2018-10-07 | Discharge: 2018-10-07 | Disposition: A | Discharge status: Home / Self Care | Payer: BLUE CROSS/BLUE SHIELD | Source: Ambulatory Visit | Attending: Family Medicine | Admitting: Family Medicine

## 2018-10-07 DIAGNOSIS — R1084 Generalized abdominal pain: Secondary | ICD-10-CM

## 2018-10-07 DIAGNOSIS — R1030 Lower abdominal pain, unspecified: Secondary | ICD-10-CM

## 2018-10-07 MED ORDER — IOPAMIDOL (ISOVUE-300) INJECTION 61%
100.0000 mL | Freq: Once | INTRAVENOUS | Status: AC | PRN
  Administered 2018-10-07: 100 mL via INTRAVENOUS

## 2018-10-08 ENCOUNTER — Encounter: Payer: Self-pay | Admitting: Family Medicine

## 2018-10-30 ENCOUNTER — Encounter: Payer: Self-pay | Admitting: Family Medicine

## 2018-10-31 ENCOUNTER — Encounter: Payer: Self-pay | Admitting: Family Medicine

## 2018-10-31 ENCOUNTER — Other Ambulatory Visit: Payer: BLUE CROSS/BLUE SHIELD

## 2018-10-31 ENCOUNTER — Telehealth (INDEPENDENT_AMBULATORY_CARE_PROVIDER_SITE_OTHER): Payer: BLUE CROSS/BLUE SHIELD | Admitting: Family Medicine

## 2018-10-31 ENCOUNTER — Other Ambulatory Visit: Payer: Self-pay

## 2018-10-31 DIAGNOSIS — R509 Fever, unspecified: Secondary | ICD-10-CM

## 2018-10-31 DIAGNOSIS — F419 Anxiety disorder, unspecified: Secondary | ICD-10-CM

## 2018-10-31 DIAGNOSIS — G43909 Migraine, unspecified, not intractable, without status migrainosus: Secondary | ICD-10-CM

## 2018-10-31 LAB — CBC
HCT: 45 % (ref 36.0–46.0)
Hemoglobin: 15.4 g/dL — ABNORMAL HIGH (ref 12.0–15.0)
MCHC: 34.2 g/dL (ref 30.0–36.0)
MCV: 93.2 fl (ref 78.0–100.0)
Platelets: 273 10*3/uL (ref 150.0–400.0)
RBC: 4.83 Mil/uL (ref 3.87–5.11)
RDW: 13.3 % (ref 11.5–15.5)
WBC: 6.6 10*3/uL (ref 4.0–10.5)

## 2018-10-31 LAB — BASIC METABOLIC PANEL
BUN: 11 mg/dL (ref 6–23)
CO2: 26 mEq/L (ref 19–32)
Calcium: 10.1 mg/dL (ref 8.4–10.5)
Chloride: 103 mEq/L (ref 96–112)
Creatinine, Ser: 0.8 mg/dL (ref 0.40–1.20)
GFR: 86.75 mL/min (ref >60.00)
Glucose, Bld: 94 mg/dL (ref 70–99)
Potassium: 4 mEq/L (ref 3.5–5.1)
Sodium: 138 mEq/L (ref 135–145)

## 2018-10-31 LAB — URINALYSIS
Bilirubin Urine: NEGATIVE
Hgb urine dipstick: NEGATIVE
Ketones, ur: NEGATIVE
Leukocytes,Ua: NEGATIVE
Nitrite: NEGATIVE
Specific Gravity, Urine: 1.015 (ref 1.000–1.030)
Total Protein, Urine: NEGATIVE
Urine Glucose: NEGATIVE
Urobilinogen, UA: 0.2 (ref 0.0–1.0)
pH: 8 (ref 5.0–8.0)

## 2018-10-31 MED ORDER — SUMATRIPTAN SUCCINATE 50 MG PO TABS: ORAL_TABLET | ORAL | 1 refills | Status: AC

## 2018-10-31 NOTE — Progress Notes (Signed)
Virtual Visit via Video Note  I connected with Linda Wallace on 10/31/18 at 10:00 AM EDT by a video enabled telemedicine application and verified that I am speaking with the correct person using two identifiers. Location patient: home Location provider: work  Persons participating in the virtual visit: patient, provider  I discussed the limitations of evaluation and management by telemedicine and the availability of in person appointments. The patient expressed understanding and agreed to proceed.  Chief Complaint  Patient presents with  . Headache    low grade fever, slight chest pain, headache/3 weeks     HPI: Linda Wallace is a 28 y.o. female who complains of 3 week h/o headaches. Pt notes associated nausea and lightheadedness.  She notes a h/o headaches but feels recent ones have been more intense and frequent. She is having headache almost daily. 3 days ago pt had pain behind her left eye.  She is taking 2 tylenol which help with but do not resolve her symptoms. She also tried taking ibuprofen but did not feel it was effective.  Sleep - a little less than normal Hydration - pt has not been drinking enough water Diet - pt has a h/o eating disorder, binge eating Stress - 2 job interviews and starting 2 new jobs  She also complains of "a little bit" of chest pain. She has these episodes 2-3 days per week and last 3-4 hrs and then resolve. No SOB. She feels she is "breathing faster but not hyperventilating". She also feels like her heart is beating faster.   She has had panic attacks in the past but states above episodes are "a very different feeling". She has a h/o anxiety. She has not taken and does not want to start med for anxiety. She takes OTC "calm tea" to help.    She also complains of a low grade fever x 3 mo. Temp 99.1-102. Average temp 100.0 Some days afebrile other days low grade fever. Some fatigue - not more than baseline. No body aches. No rash.   Past Medical History:   Diagnosis Date  . Allergy   . Depression   . Eating disorder   . Medical history non-contributory     Past Surgical History:  Procedure Laterality Date  . INDUCED ABORTION    . INDUCED ABORTION  2016    Family History  Problem Relation Age of Onset  . Hypertension Other   . Diabetes Other   . Crohn's disease Mother   . Cancer Paternal Uncle   . Alcohol abuse Father   . Arthritis Father   . Depression Father   . Drug abuse Father   . Early death Father   . Hearing loss Father   . Heart disease Father   . Hyperlipidemia Father   . Hypertension Father   . Mental illness Father     Social History   Tobacco Use  . Smoking status: Former Smoker    Packs/day: 0.50    Types: Cigarettes    Quit date: 01/11/2016    Years since quitting: 2.8  . Smokeless tobacco: Never Used  Substance Use Topics  . Alcohol use: Not Currently    Comment: =  . Drug use: Not Currently     Current Outpatient Medications:  .  SUMAtriptan (IMITREX) 50 MG tablet, Take 1 tab po x 1 dose, repeat in 1-2 hrs if headache persists, Disp: 10 tablet, Rfl: 1  Allergies  Allergen Reactions  . Food Swelling    PATIENT IS  ALLERGIC TO ALL FRUIT---HIVES/TONGUE SWELL/THROAT CLOSES (Allergic to fruit)      ROS: See pertinent positives and negatives per HPI.   EXAM:  VITALS per patient if applicable: There were no vitals taken for this visit.   GENERAL: alert, oriented, appears well and in no acute distress  HEENT: atraumatic, conjunctiva clear, no obvious abnormalities on inspection of external nose and ears  NECK: normal movements of the head and neck  LUNGS: on inspection no signs of respiratory distress, breathing rate appears normal, no obvious gross SOB, gasping or wheezing, no conversational dyspnea  CV: no obvious cyanosis  MS: moves all visible extremities without noticeable abnormality  PSYCH/NEURO: pleasant and cooperative, no obvious depression or anxiety, speech and thought  processing grossly intact   ASSESSMENT AND PLAN:  1. Migraine without status migrainosus, not intractable, unspecified migraine type - increase water intake, recommended 8hrs sleep/night, regular meals/snacks - pt with increase stress related to new jobs - recommended pt keep a log of symptoms Rx: - SUMAtriptan (IMITREX) 50 MG tablet; Take 1 tab po x 1 dose, repeat in 1-2 hrs if headache persists  Dispense: 10 tablet; Refill: 1 - f/u in 2 wks if no/minimal improvement Discussed plan and reviewed medications with patient, including risks, benefits, and potential side effects. Pt expressed understand. All questions answered.  2. Fever, unspecified fever cause - x 3 mo, intermittent - no recent travel - CBC - Basic metabolic panel - B. burgdorfi antibodies - Urinalysis  3. Anxiety - I believe pts episodes chest pain, increased heart rate and respi rate are related to underlying anxiety which is not adequately managed. Pt does not fully agree with this but does endorse anxiety. She is not agreeable to meds or BH counseling at this time    I discussed the assessment and treatment plan with the patient. The patient was provided an opportunity to ask questions and all were answered. The patient agreed with the plan and demonstrated an understanding of the instructions.   The patient was advised to call back or seek an in-person evaluation if the symptoms worsen or if the condition fails to improve as anticipated.   Luana ShuMary Caprice Mccaffrey, DO

## 2018-11-01 ENCOUNTER — Encounter: Payer: Self-pay | Admitting: Family Medicine

## 2018-11-01 DIAGNOSIS — F419 Anxiety disorder, unspecified: Secondary | ICD-10-CM

## 2018-11-03 ENCOUNTER — Other Ambulatory Visit: Payer: Self-pay

## 2018-11-03 DIAGNOSIS — Z20822 Contact with and (suspected) exposure to covid-19: Secondary | ICD-10-CM

## 2018-11-04 LAB — B. BURGDORFI ANTIBODIES: B burgdorferi Ab IgG+IgM: <0.9 index

## 2018-11-04 LAB — NOVEL CORONAVIRUS, NAA: SARS-CoV-2, NAA: NOT DETECTED

## 2018-11-05 ENCOUNTER — Encounter: Payer: Self-pay | Admitting: Family Medicine

## 2018-11-07 MED ORDER — BUSPIRONE HCL 10 MG PO TABS: 10.0000 mg | ORAL_TABLET | Freq: Two times a day (BID) | ORAL | 2 refills | Status: AC

## 2018-11-20 ENCOUNTER — Other Ambulatory Visit: Payer: Self-pay

## 2018-11-20 DIAGNOSIS — Z20822 Contact with and (suspected) exposure to covid-19: Secondary | ICD-10-CM

## 2018-11-22 LAB — NOVEL CORONAVIRUS, NAA: SARS-CoV-2, NAA: NOT DETECTED

## 2018-12-17 ENCOUNTER — Encounter: Payer: Self-pay | Admitting: Family Medicine

## 2018-12-18 ENCOUNTER — Other Ambulatory Visit: Payer: Self-pay

## 2018-12-18 DIAGNOSIS — Z20822 Contact with and (suspected) exposure to covid-19: Secondary | ICD-10-CM

## 2018-12-20 LAB — NOVEL CORONAVIRUS, NAA: SARS-CoV-2, NAA: NOT DETECTED

## 2019-01-01 ENCOUNTER — Other Ambulatory Visit: Payer: Self-pay

## 2019-01-01 DIAGNOSIS — Z20822 Contact with and (suspected) exposure to covid-19: Secondary | ICD-10-CM

## 2019-01-03 LAB — NOVEL CORONAVIRUS, NAA: SARS-CoV-2, NAA: NOT DETECTED

## 2019-02-03 ENCOUNTER — Other Ambulatory Visit: Payer: Self-pay

## 2019-02-03 DIAGNOSIS — Z20822 Contact with and (suspected) exposure to covid-19: Secondary | ICD-10-CM

## 2019-02-05 LAB — NOVEL CORONAVIRUS, NAA: SARS-CoV-2, NAA: NOT DETECTED

## 2019-02-26 ENCOUNTER — Other Ambulatory Visit: Payer: BLUE CROSS/BLUE SHIELD

## 2019-03-23 ENCOUNTER — Other Ambulatory Visit: Payer: Self-pay

## 2019-03-23 ENCOUNTER — Encounter (HOSPITAL_COMMUNITY): Payer: Self-pay

## 2019-03-23 ENCOUNTER — Emergency Department (HOSPITAL_COMMUNITY): Payer: BLUE CROSS/BLUE SHIELD

## 2019-03-23 DIAGNOSIS — R0789 Other chest pain: Secondary | ICD-10-CM | POA: Insufficient documentation

## 2019-03-23 DIAGNOSIS — Z87891 Personal history of nicotine dependence: Secondary | ICD-10-CM | POA: Diagnosis not present

## 2019-03-23 LAB — I-STAT BETA HCG BLOOD, ED (NOT ORDERABLE): I-stat hCG, quantitative: <5 m[IU]/mL (ref <5)

## 2019-03-23 LAB — CBC
HCT: 41.5 % (ref 36.0–46.0)
Hemoglobin: 13.8 g/dL (ref 12.0–15.0)
MCH: 31.7 pg (ref 26.0–34.0)
MCHC: 33.3 g/dL (ref 30.0–36.0)
MCV: 95.2 fL (ref 80.0–100.0)
Platelets: 261 10*3/uL (ref 150–400)
RBC: 4.36 MIL/uL (ref 3.87–5.11)
RDW: 12.6 % (ref 11.5–15.5)
WBC: 8.2 10*3/uL (ref 4.0–10.5)
nRBC: 0 % (ref 0.0–0.2)

## 2019-03-23 NOTE — ED Triage Notes (Signed)
Pt reports she started having sharp pain right below both collar bones about 10 hours ago. Pt reports in the last few hours the pain has radiated down her left side.

## 2019-03-24 ENCOUNTER — Emergency Department (HOSPITAL_COMMUNITY)
Admission: EM | Admit: 2019-03-24 | Discharge: 2019-03-24 | Disposition: A | Discharge status: Home / Self Care | Payer: BLUE CROSS/BLUE SHIELD | Attending: Emergency Medicine | Admitting: Emergency Medicine

## 2019-03-24 DIAGNOSIS — R0789 Other chest pain: Secondary | ICD-10-CM

## 2019-03-24 LAB — BASIC METABOLIC PANEL
Anion gap: 7 (ref 5–15)
BUN: 11 mg/dL (ref 6–20)
CO2: 28 mmol/L (ref 22–32)
Calcium: 9.3 mg/dL (ref 8.9–10.3)
Chloride: 105 mmol/L (ref 98–111)
Creatinine, Ser: 0.65 mg/dL (ref 0.44–1.00)
GFR calc Af Amer: >60 mL/min (ref >60)
GFR calc non Af Amer: >60 mL/min (ref >60)
Glucose, Bld: 101 mg/dL — ABNORMAL HIGH (ref 70–99)
Potassium: 3.5 mmol/L (ref 3.5–5.1)
Sodium: 140 mmol/L (ref 135–145)

## 2019-03-24 LAB — D-DIMER, QUANTITATIVE: D-Dimer, Quant: <0.27 ug/mL-FEU (ref 0.00–0.50)

## 2019-03-24 LAB — TROPONIN I (HIGH SENSITIVITY)
Troponin I (High Sensitivity): <2 ng/L (ref <18)
Troponin I (High Sensitivity): <2 ng/L (ref <18)

## 2019-03-24 MED ORDER — CYCLOBENZAPRINE HCL 10 MG PO TABS: 10.0000 mg | ORAL_TABLET | Freq: Three times a day (TID) | ORAL | 0 refills | Status: AC | PRN

## 2019-03-24 MED ORDER — IBUPROFEN 800 MG PO TABS: 800.0000 mg | ORAL_TABLET | Freq: Four times a day (QID) | ORAL | 0 refills | Status: AC | PRN

## 2019-03-24 NOTE — ED Provider Notes (Signed)
[facility]-EMERGENCY DEPT Provider Note   CSN: [id number] Arrival date & time: 03/23/19  2214     History Chief Complaint  Patient presents with  . Chest Pain    Linda Wallace is a 29 y.o. female.  Patient presents to the emergency department for evaluation of chest pain.  Patient reports a sharp pain in the bilateral upper chest area.  Pain appears to be centered around the area of the clavicle bilaterally.  She has noticed increased pain when she takes a deep breath but also when she moves her arms, such as raising them above her head.  No associated shortness of breath.  Patient denies injury.        Past Medical History:  Diagnosis Date  . Allergy   . Depression   . Eating disorder   . Medical history non-contributory     There are no problems to display for this patient.   Past Surgical History:  Procedure Laterality Date  . INDUCED ABORTION    . INDUCED ABORTION  2016     OB History    Gravida  2   Para      Term      Preterm      AB  2   Living        SAB  1   TAB  1   Ectopic      Multiple      Live Births              Family History  Problem Relation Age of Onset  . Hypertension Other   . Diabetes Other   . Crohn's disease Mother   . Cancer Paternal Uncle   . Alcohol abuse Father   . Arthritis Father   . Depression Father   . Drug abuse Father   . Early death Father   . Hearing loss Father   . Heart disease Father   . Hyperlipidemia Father   . Hypertension Father   . Mental illness Father     Social History   Tobacco Use  . Smoking status: Former Smoker    Packs/day: 0.50    Types: Cigarettes    Quit date: 01/11/2016    Years since quitting: 3.2  . Smokeless tobacco: Never Used  Substance Use Topics  . Alcohol use: Not Currently    Comment: =  . Drug use: Not Currently    Home Medications Prior to Admission medications   Medication Sig Start Date End Date Taking? Authorizing Provider    busPIRone (BUSPAR) 10 MG tablet Take 1 tablet (10 mg total) by mouth 2 (two) times daily. Patient not taking: Reported on 03/24/2019 11/07/18   Overton Mam, DO  cyclobenzaprine (FLEXERIL) 10 MG tablet Take 1 tablet (10 mg total) by mouth 3 (three) times daily as needed for muscle spasms. 03/24/19   Gilda Crease, MD  ibuprofen (ADVIL) 800 MG tablet Take 1 tablet (800 mg total) by mouth every 6 (six) hours as needed for moderate pain. 03/24/19   Gilda Crease, MD  SUMAtriptan (IMITREX) 50 MG tablet Take 1 tab po x 1 dose, repeat in 1-2 hrs if headache persists Patient not taking: Reported on 03/24/2019 10/31/18   Overton Mam, DO    Allergies    Food  Review of Systems   Review of Systems  Cardiovascular: Positive for chest pain.  All other systems reviewed and are negative.   Physical Exam Updated Vital Signs BP Marland Kitchen)  86/71 (BP Location: Right Arm)   Pulse 76   Temp 98.4 F (36.9 C) (Oral)   Resp (!) 23   LMP 02/23/2019   SpO2 94%   Physical Exam Vitals and nursing note reviewed.  Constitutional:      General: She is not in acute distress.    Appearance: Normal appearance. She is well-developed.  HENT:     Head: Normocephalic and atraumatic.     Right Ear: Hearing normal.     Left Ear: Hearing normal.     Nose: Nose normal.  Eyes:     Conjunctiva/sclera: Conjunctivae normal.     Pupils: Pupils are equal, round, and reactive to light.  Cardiovascular:     Rate and Rhythm: Regular rhythm.     Heart sounds: S1 normal and S2 normal. No murmur. No friction rub. No gallop.   Pulmonary:     Effort: Pulmonary effort is normal. No respiratory distress.     Breath sounds: Normal breath sounds.  Chest:     Chest wall: Tenderness present.       Comments: Tenderness of bilateral medial clavicle area without any overlying erythema, warmth, redness or swelling Abdominal:     General: Bowel sounds are normal.     Palpations: Abdomen is soft.      Tenderness: There is no abdominal tenderness. There is no guarding or rebound. Negative signs include Murphy's sign and McBurney's sign.     Hernia: No hernia is present.  Musculoskeletal:        General: Normal range of motion.     Cervical back: Normal range of motion and neck supple.  Skin:    General: Skin is warm and dry.     Findings: No rash.  Neurological:     Mental Status: She is alert and oriented to person, place, and time.     GCS: GCS eye subscore is 4. GCS verbal subscore is 5. GCS motor subscore is 6.     Cranial Nerves: No cranial nerve deficit.     Sensory: No sensory deficit.     Coordination: Coordination normal.  Psychiatric:        Speech: Speech normal.        Behavior: Behavior normal.        Thought Content: Thought content normal.     ED Results / Procedures / Treatments   Labs (all labs ordered are listed, but only abnormal results are displayed) Labs Reviewed  BASIC METABOLIC PANEL - Abnormal; Notable for the following components:      Result Value   Glucose, Bld 101 (*)    All other components within normal limits  CBC  D-DIMER, QUANTITATIVE (NOT AT [facility])  I-STAT BETA HCG BLOOD, ED ([facility], [facility], [facility] ONLY)  I-STAT BETA HCG BLOOD, ED (NOT ORDERABLE)  TROPONIN I (HIGH SENSITIVITY)  TROPONIN I (HIGH SENSITIVITY)    EKG EKG Interpretation  Date/Time:  Monday March 23 2019 22:24:56 EST Ventricular Rate:  79 PR Interval:    QRS Duration: 75 QT Interval:  345 QTC Calculation: 396 R Axis:   95 Text Interpretation: Sinus rhythm Borderline short PR interval Borderline right axis deviation RSR' in V1 or V2, probably normal variant No significant change since Confirmed by Gilda Crease [phone]) on 03/24/2019 4:21:42 AM   Radiology DG Chest 2 View  Result Date: 03/23/2019 CLINICAL DATA:  Chest pain EXAM: CHEST - 2 VIEW COMPARISON:  08/19/2018 FINDINGS: The heart size and mediastinal contours are within normal limits. Both lungs  are clear. The  visualized skeletal structures are unremarkable. IMPRESSION: Normal study. Electronically Signed   By: Rolm Baptise M.D.   On: 03/23/2019 22:58    Procedures Procedures (including critical care time)  Medications Ordered in ED Medications - No data to display  ED Course  I have reviewed the triage vital signs and the nursing notes.  Pertinent labs & imaging results that were available during my care of the patient were reviewed by me and considered in my medical decision making (see chart for details).    MDM Rules/Calculators/A&P                      Patient presents to the emergency department for evaluation of chest pain.  Patient complaining of bilateral upper chest pain in the area of the medial clavicle.  No injury noted.  Examination reveals tenderness but no swelling or signs of infection.  Patient does report pain with breathing but there is also reproducible pain with palpation and movement.  This is consistent with chest wall pain.  She is PERC negative.  D-dimer is normal.  Troponin negative x2 with a normal EKG and x-ray.  Will treat for musculoskeletal chest pain.  Final Clinical Impression(s) / ED Diagnoses Final diagnoses:  Chest wall pain    Rx / DC Orders ED Discharge Orders         Ordered    ibuprofen (ADVIL) 800 MG tablet  Every 6 hours PRN     03/24/19 0621    cyclobenzaprine (FLEXERIL) 10 MG tablet  3 times daily PRN     03/24/19 7253           Orpah Greek, MD 03/24/19 [phone]

## 2019-03-24 NOTE — ED Notes (Signed)
Patient was verbalized discharge instructions. Pt had no further questions at this time. NAD. 

## 2019-03-27 ENCOUNTER — Encounter: Payer: Self-pay | Admitting: Family Medicine

## 2019-04-01 MED ORDER — METRONIDAZOLE 500 MG PO TABS: 500.0000 mg | ORAL_TABLET | Freq: Two times a day (BID) | ORAL | 0 refills | Status: AC

## 2019-07-17 ENCOUNTER — Emergency Department (HOSPITAL_BASED_OUTPATIENT_CLINIC_OR_DEPARTMENT_OTHER): Payer: BLUE CROSS/BLUE SHIELD

## 2019-07-17 ENCOUNTER — Encounter (HOSPITAL_BASED_OUTPATIENT_CLINIC_OR_DEPARTMENT_OTHER): Payer: Self-pay | Admitting: *Deleted

## 2019-07-17 ENCOUNTER — Emergency Department (HOSPITAL_BASED_OUTPATIENT_CLINIC_OR_DEPARTMENT_OTHER)
Admission: EM | Admit: 2019-07-17 | Discharge: 2019-07-17 | Disposition: A | Discharge status: Home / Self Care | Payer: BLUE CROSS/BLUE SHIELD | Attending: Emergency Medicine | Admitting: Emergency Medicine

## 2019-07-17 ENCOUNTER — Other Ambulatory Visit: Payer: Self-pay

## 2019-07-17 DIAGNOSIS — R0789 Other chest pain: Secondary | ICD-10-CM | POA: Diagnosis not present

## 2019-07-17 DIAGNOSIS — Z87891 Personal history of nicotine dependence: Secondary | ICD-10-CM | POA: Diagnosis not present

## 2019-07-17 DIAGNOSIS — R071 Chest pain on breathing: Secondary | ICD-10-CM | POA: Insufficient documentation

## 2019-07-17 DIAGNOSIS — R079 Chest pain, unspecified: Secondary | ICD-10-CM | POA: Diagnosis present

## 2019-07-17 MED ORDER — KETOROLAC TROMETHAMINE 30 MG/ML IJ SOLN
15.0000 mg | Freq: Once | INTRAMUSCULAR | Status: AC
  Administered 2019-07-17: 21:00:00 15 mg via INTRAMUSCULAR
  Filled 2019-07-17: qty 1

## 2019-07-17 NOTE — ED Notes (Signed)
Patient ambulatory to x-ray.

## 2019-07-17 NOTE — ED Notes (Signed)
Pt sts she is under a great deal of stress d/t moving to [place].

## 2019-07-17 NOTE — ED Triage Notes (Signed)
Pt reports that she has central CP, nonradiating with SOB and bilateral finger tingling. Reports that it started 30 minutes PTA. Pt very anxious, crying in triage.

## 2019-07-17 NOTE — Discharge Instructions (Signed)
Take 4 over the counter ibuprofen tablets 3 times a day or 2 over-the-counter naproxen tablets twice a day for pain. Also take tylenol 1000mg(2 extra strength) four times a day.    

## 2019-07-17 NOTE — ED Provider Notes (Signed)
[facility] EMERGENCY DEPARTMENT Provider Note   CSN: [id number] Arrival date & time: 07/17/19  2002     History Chief Complaint  Patient presents with  . Chest Pain    Linda Wallace is a 29 y.o. female.  29 yo F with a chief complaints of chest pain.  This started today.  Worse with certain positions and deep breathing and coughing.  Has been coughing for couple days.  No fevers.  No significant shortness of breath.  Notes that initially when she went up a flight of stairs.  Started feeling like the discomfort got much worse and decided to come to the ED for evaluation.  She has had pain like this before but never that lasted this long.  She denies history of MI denies hypertension hyperlipidemia diabetes or smoking.  She does say that she vapes.  She denies family history of MI.  She denies history of PE or DVT denies hemoptysis denies unilateral extremity edema denies recent surgery immobilization or hospitalization denies estrogen use.  Denies history of cancer.  The history is provided by the patient.  Chest Pain Associated symptoms: no dizziness, no fever, no headache, no nausea, no palpitations, no shortness of breath and no vomiting   Illness Severity:  Moderate Onset quality:  Gradual Duration:  1 day Timing:  Constant Progression:  Worsening Chronicity:  New Associated symptoms: chest pain   Associated symptoms: no congestion, no fever, no headaches, no myalgias, no nausea, no rhinorrhea, no shortness of breath, no vomiting and no wheezing        Past Medical History:  Diagnosis Date  . Allergy   . Depression   . Eating disorder   . Medical history non-contributory     There are no problems to display for this patient.   Past Surgical History:  Procedure Laterality Date  . INDUCED ABORTION    . INDUCED ABORTION  2016     OB History    Gravida  2   Para      Term      Preterm      AB  2   Living        SAB  1   TAB  1   Ectopic       Multiple      Live Births              Family History  Problem Relation Age of Onset  . Hypertension Other   . Diabetes Other   . Crohn's disease Mother   . Cancer Paternal Uncle   . Alcohol abuse Father   . Arthritis Father   . Depression Father   . Drug abuse Father   . Early death Father   . Hearing loss Father   . Heart disease Father   . Hyperlipidemia Father   . Hypertension Father   . Mental illness Father     Social History   Tobacco Use  . Smoking status: Former Smoker    Packs/day: 0.50    Types: Cigarettes    Quit date: 01/11/2016    Years since quitting: 3.5  . Smokeless tobacco: Never Used  Substance Use Topics  . Alcohol use: Not Currently    Comment: =  . Drug use: Not Currently    Home Medications Prior to Admission medications   Medication Sig Start Date End Date Taking? Authorizing Provider  ISOtretinoin (ACCUTANE) 40 MG capsule Take by mouth. 07/15/19 08/14/19 Yes [provider]  busPIRone (  BUSPAR) 10 MG tablet Take 1 tablet (10 mg total) by mouth 2 (two) times daily. Patient not taking: Reported on 03/24/2019 11/07/18   Overton Mam, DO  cyclobenzaprine (FLEXERIL) 10 MG tablet Take 1 tablet (10 mg total) by mouth 3 (three) times daily as needed for muscle spasms. 03/24/19   Gilda Crease, MD  ibuprofen (ADVIL) 800 MG tablet Take 1 tablet (800 mg total) by mouth every 6 (six) hours as needed for moderate pain. 03/24/19   Gilda Crease, MD  SUMAtriptan (IMITREX) 50 MG tablet Take 1 tab po x 1 dose, repeat in 1-2 hrs if headache persists Patient not taking: Reported on 03/24/2019 10/31/18   Overton Mam, DO    Allergies    Food  Review of Systems   Review of Systems  Constitutional: Negative for chills and fever.  HENT: Negative for congestion and rhinorrhea.   Eyes: Negative for redness and visual disturbance.  Respiratory: Negative for shortness of breath and wheezing.   Cardiovascular: Positive for  chest pain. Negative for palpitations.  Gastrointestinal: Negative for nausea and vomiting.  Genitourinary: Negative for dysuria and urgency.  Musculoskeletal: Negative for arthralgias and myalgias.  Skin: Negative for pallor and wound.  Neurological: Negative for dizziness and headaches.    Physical Exam Updated Vital Signs BP 120/67   Pulse 80   Temp 98.1 F (36.7 C) (Oral)   Resp (!) 26   Ht 5\' 5"  (1.651 m)   Wt 47.6 kg   SpO2 99%   BMI 17.47 kg/m   Physical Exam Vitals and nursing note reviewed.  Constitutional:      General: She is not in acute distress.    Appearance: She is well-developed. She is not diaphoretic.  HENT:     Head: Normocephalic and atraumatic.  Eyes:     Pupils: Pupils are equal, round, and reactive to light.  Cardiovascular:     Rate and Rhythm: Normal rate and regular rhythm.     Heart sounds: No murmur. No friction rub. No gallop.   Pulmonary:     Effort: Pulmonary effort is normal.     Breath sounds: No wheezing or rales.  Chest:     Chest wall: Tenderness present.     Comments: Pain with palpation about the sternum.  Reproduces the patients symptoms.  Abdominal:     General: There is no distension.     Palpations: Abdomen is soft.     Tenderness: There is no abdominal tenderness.  Musculoskeletal:        General: No tenderness.     Cervical back: Normal range of motion and neck supple.  Skin:    General: Skin is warm and dry.  Neurological:     Mental Status: She is alert and oriented to person, place, and time.  Psychiatric:        Behavior: Behavior normal.     ED Results / Procedures / Treatments   Labs (all labs ordered are listed, but only abnormal results are displayed) Labs Reviewed - No data to display  EKG EKG Interpretation  Date/Time:  Friday Jul 17 2019 20:05:49 EDT Ventricular Rate:  113 PR Interval:  118 QRS Duration: 80 QT Interval:  280 QTC Calculation: 384 R Axis:   91 Text Interpretation: Sinus  tachycardia Rightward axis Since last tracing rate faster Confirmed by [birth year] [phone]) on 07/17/2019 9:00:53 PM   Radiology DG Chest 2 View  Result Date: 07/17/2019 CLINICAL DATA:  Chest pain for 4  days, headache for 1 month, cough EXAM: CHEST - 2 VIEW COMPARISON:  03/23/2019 FINDINGS: Frontal and lateral views of the chest demonstrate a stable cardiac silhouette. The lungs are hyperinflated with mild interstitial prominence, chronic. No airspace disease, effusion, or pneumothorax. IMPRESSION: 1. Stable exam, no acute process. 2. Hyperinflation. Electronically Signed   By: Randa Ngo M.D.   On: 07/17/2019 21:34    Procedures Procedures (including critical care time)  Medications Ordered in ED Medications  ketorolac (TORADOL) 30 MG/ML injection 15 mg (15 mg Intramuscular Given 07/17/19 2111)    ED Course  I have reviewed the triage vital signs and the nursing notes.  Pertinent labs & imaging results that were available during my care of the patient were reviewed by me and considered in my medical decision making (see chart for details).    MDM Rules/Calculators/A&P                      29 yo F with a chief complaint of chest pain.  This is atypical in nature and reproduced on exam.  Seems completely atypical of ACS she has an EKG with sinus tachycardia but no obvious ischemic changes.  Pain is reproduced on palpation of the anterior chest wall seems completely atypical of a pulmonary embolism.  Patient does have an underlying history of anxiety and does seem a bit anxious on exam is actually tearful while I am talking with her about the possible diagnosis.  Will obtain a chest x-ray.  CXR viewed by me without pna or ptx.  D/c home.  9:40 PM:  I have discussed the diagnosis/risks/treatment options with the patient and believe the pt to be eligible for discharge home to follow-up with PCP. We also discussed returning to the ED immediately if new or worsening sx occur. We discussed the sx  which are most concerning (e.g., sudden worsening pain, fever, inability to tolerate by mouth) that necessitate immediate return. Medications administered to the patient during their visit and any new prescriptions provided to the patient are listed below.  Medications given during this visit Medications  ketorolac (TORADOL) 30 MG/ML injection 15 mg (15 mg Intramuscular Given 07/17/19 2111)     The patient appears reasonably screen and/or stabilized for discharge and I doubt any other medical condition or other [facility] requiring further screening, evaluation, or treatment in the ED at this time prior to discharge.   Final Clinical Impression(s) / ED Diagnoses Final diagnoses:  Chest wall pain    Rx / DC Orders ED Discharge Orders    None       Deno Etienne, DO 07/17/19 2140

## 2019-07-20 ENCOUNTER — Ambulatory Visit: Payer: BLUE CROSS/BLUE SHIELD | Admitting: Pulmonary Disease

## 2019-07-20 ENCOUNTER — Encounter: Payer: Self-pay | Admitting: Pulmonary Disease

## 2019-07-20 ENCOUNTER — Other Ambulatory Visit: Payer: Self-pay

## 2019-07-20 VITALS — BP 104/60 | HR 82 | Temp 97.9°F | Ht 65.0 in | Wt 105.0 lb

## 2019-07-20 DIAGNOSIS — Z7289 Other problems related to lifestyle: Secondary | ICD-10-CM

## 2019-07-20 DIAGNOSIS — J301 Allergic rhinitis due to pollen: Secondary | ICD-10-CM | POA: Diagnosis not present

## 2019-07-20 DIAGNOSIS — J4540 Moderate persistent asthma, uncomplicated: Secondary | ICD-10-CM | POA: Diagnosis not present

## 2019-07-20 MED ORDER — ALBUTEROL SULFATE HFA 108 (90 BASE) MCG/ACT IN AERS: 2.0000 | INHALATION_SPRAY | Freq: Four times a day (QID) | RESPIRATORY_TRACT | 1 refills | Status: AC | PRN

## 2019-07-20 NOTE — Progress Notes (Signed)
[facility]  Chief Complaint  Patient presents with  . Consult    Patient went to ED on 5/7 for severe chest pain and was told her lungs were hyperinflating and that she needs to be seen by a lung doctor. Denies SOB     Constitutional:  BP 104/60 (BP Location: Left Arm, Patient Position: Sitting, Cuff Size: Normal)   Pulse 82   Temp 97.9 F (36.6 C) (Temporal)   Ht 5\' 5"  (1.651 m)   Wt 105 lb (47.6 kg)   SpO2 98%   BMI 17.47 kg/m   Past Medical History:  Allergies, Depression, Eating disorder  Summary:  Linda Wallace is a 29 y.o. female former smoker with atypical chest pain and cough.  Subjective:   She was seen in ER on 07/17/19 for chest pain.  Reports she started exercising again that morning after 5 months.  She developed sharp pain across her chest.  This was associated with feeling like she couldn't take a deep breath and cough.  She gets these episodes intermittently.  She has never been told she has asthma.  She gets seasonal allergies with Springtime usually worst season.  She takes OTC allergy pills.  Never had allergy testing before.  Gets burning sensation in her mouth after eating certain fruits.  No medication allergies.  Denies eczema or hives.  Sometimes wakes up with a cough.  Never had breathing test.  Denies fever, or hemoptysis.  Quit smoking several years ago.  Used to smoke marijuana, but this caused vomiting and she stopped about 1 month ago.  She vaps on a daily basis.  CXR from 07/17/19 (reviewed by me) showed hyperinflation.  Physical Exam:   Appearance - well kempt  ENMT - no sinus tenderness, no nasal discharge, no oral exudate, Mallampati 3, scalloped tongue  Respiratory - no wheeze, or rales  CV - regular rate and rhythm, no murmurs  GI - soft, non tender  Lymph - no adenopathy noted in neck  Ext - no edema  Skin - no rashes  Neuro - normal strength, oriented x 3  Psych - intermittently  tearful, anxious  Discussion:  She reports intermittent episodes of cough, dyspnea, and chest discomfort.  These are triggered by allergies, smoke, exercise and vaping.  Her recent chest xray showed hyperinflation.  She most likely has asthma.  Assessment/Plan:   Asthma. - will arrange for PFT to further assess - prn albuterol for now  Allergic rhinitis. - continue prn OTC antihistamine pill for now  Vaping. - discussed importance of quitting - she will work on this  A total of  47 minutes spent addressing patient care issues on day of visit.   Follow up:  Patient Instructions  Albuterol two puffs every 4 to 6 hours as needed for cough, wheezing, chest discomfort or shortness of breath  Will schedule pulmonary function test  Follow up in 8 weeks   Signature:  Chesley Mires, MD [place] Pager: [phone] 07/20/2019, 11:40 AM  Flow Sheet     Pulmonary tests:    Chest imaging:  CT angio chest 04/14/15 >> no acute findings  Medications:   Allergies as of 07/20/2019      Reactions   Food Swelling   PATIENT IS ALLERGIC TO ALL FRUIT---HIVES/TONGUE SWELL/THROAT CLOSES (Allergic to fruit)      Medication List       Accurate as of Jul 20, 2019 11:40 AM. If you have any questions, ask  your nurse or doctor.        albuterol 108 (90 Base) MCG/ACT inhaler Commonly known as: VENTOLIN HFA Inhale 2 puffs into the lungs every 6 (six) hours as needed for wheezing or shortness of breath. Started by: Coralyn Helling, MD   busPIRone 10 MG tablet Commonly known as: BUSPAR Take 1 tablet (10 mg total) by mouth 2 (two) times daily.   cyclobenzaprine 10 MG tablet Commonly known as: FLEXERIL Take 1 tablet (10 mg total) by mouth 3 (three) times daily as needed for muscle spasms.   ibuprofen 800 MG tablet Commonly known as: ADVIL Take 1 tablet (800 mg total) by mouth every 6 (six) hours as needed for moderate pain.   ISOtretinoin 40 MG capsule Commonly  known as: ACCUTANE Take by mouth.   SUMAtriptan 50 MG tablet Commonly known as: IMITREX Take 1 tab po x 1 dose, repeat in 1-2 hrs if headache persists       Past Surgical History:  She  has a past surgical history that includes Induced abortion and Induced abortion (2016).  Family History:  Her family history includes Alcohol abuse in her father; Arthritis in her father; Cancer in her paternal uncle; Crohn's disease in her mother; Depression in her father; Diabetes in an other family member; Drug abuse in her father; Early death in her father; Hearing loss in her father; Heart disease in her father; Hyperlipidemia in her father; Hypertension in her father and another family member; Mental illness in her father.  Social History:  She  reports that she quit smoking about 3 years ago. Her smoking use included cigarettes. She smoked 0.50 packs per day. She has never used smokeless tobacco. She reports previous alcohol use. She reports previous drug use.

## 2019-07-20 NOTE — Patient Instructions (Signed)
Albuterol two puffs every 4 to 6 hours as needed for cough, wheezing, chest discomfort or shortness of breath  Will schedule pulmonary function test  Follow up in 8 weeks

## 2019-08-28 ENCOUNTER — Other Ambulatory Visit (HOSPITAL_COMMUNITY)
Admission: RE | Admit: 2019-08-28 | Discharge: 2019-08-28 | Disposition: A | Discharge status: Home / Self Care | Payer: BLUE CROSS/BLUE SHIELD | Source: Ambulatory Visit | Attending: Pulmonary Disease | Admitting: Pulmonary Disease

## 2019-08-28 DIAGNOSIS — Z20822 Contact with and (suspected) exposure to covid-19: Secondary | ICD-10-CM | POA: Diagnosis not present

## 2019-08-28 DIAGNOSIS — Z01812 Encounter for preprocedural laboratory examination: Secondary | ICD-10-CM | POA: Diagnosis present

## 2019-08-28 LAB — SARS CORONAVIRUS 2 (TAT 6-24 HRS): SARS Coronavirus 2: NEGATIVE

## 2019-08-31 ENCOUNTER — Other Ambulatory Visit: Payer: Self-pay

## 2019-08-31 ENCOUNTER — Ambulatory Visit (INDEPENDENT_AMBULATORY_CARE_PROVIDER_SITE_OTHER): Payer: BLUE CROSS/BLUE SHIELD | Admitting: Pulmonary Disease

## 2019-08-31 ENCOUNTER — Encounter: Payer: Self-pay | Admitting: *Deleted

## 2019-08-31 DIAGNOSIS — J4540 Moderate persistent asthma, uncomplicated: Secondary | ICD-10-CM

## 2019-08-31 LAB — PULMONARY FUNCTION TEST
DL/VA % pred: 105 %
DL/VA: 4.92 ml/min/mmHg/L
DLCO cor % pred: 112 %
DLCO cor: 25.17 ml/min/mmHg
DLCO unc % pred: 112 %
DLCO unc: 25.17 ml/min/mmHg
FEF 25-75 Post: 4.37 L/s
FEF 25-75 Pre: 3.46 L/s
FEF2575-%Change-Post: 26 %
FEF2575-%Pred-Post: 122 %
FEF2575-%Pred-Pre: 96 %
FEV1-%Change-Post: 6 %
FEV1-%Pred-Post: 108 %
FEV1-%Pred-Pre: 101 %
FEV1-Post: 3.52 L
FEV1-Pre: 3.29 L
FEV1FVC-%Change-Post: 6 %
FEV1FVC-%Pred-Pre: 100 %
FEV6-%Change-Post: 0 %
FEV6-%Pred-Post: 102 %
FEV6-%Pred-Pre: 101 %
FEV6-Post: 3.85 L
FEV6-Pre: 3.82 L
FEV6FVC-%Pred-Post: 100 %
FEV6FVC-%Pred-Pre: 100 %
FVC-%Change-Post: 0 %
FVC-%Pred-Post: 101 %
FVC-%Pred-Pre: 100 %
FVC-Post: 3.85 L
FVC-Pre: 3.82 L
Post FEV1/FVC ratio: 91 %
Post FEV6/FVC ratio: 100 %
Pre FEV1/FVC ratio: 86 %
Pre FEV6/FVC Ratio: 100 %

## 2019-08-31 NOTE — Progress Notes (Signed)
PFT Done today. 

## 2019-09-14 ENCOUNTER — Telehealth: Payer: Self-pay | Admitting: Pulmonary Disease

## 2019-09-14 NOTE — Telephone Encounter (Signed)
Please schedule ROV with me or one of the NPs to review her PFT results and status of asthma therapy.

## 2019-09-15 NOTE — Telephone Encounter (Signed)
Spoke with pt, scheduled for a televisit with Arlys John on 7/13 to discuss PFT further.  Nothing further needed at this time- will close encounter.

## 2019-09-22 ENCOUNTER — Encounter: Payer: Self-pay | Admitting: Pulmonary Disease

## 2019-09-22 ENCOUNTER — Other Ambulatory Visit: Payer: Self-pay

## 2019-09-22 ENCOUNTER — Ambulatory Visit (INDEPENDENT_AMBULATORY_CARE_PROVIDER_SITE_OTHER): Payer: BLUE CROSS/BLUE SHIELD | Admitting: Pulmonary Disease

## 2019-09-22 DIAGNOSIS — J4540 Moderate persistent asthma, uncomplicated: Secondary | ICD-10-CM | POA: Diagnosis not present

## 2019-09-22 DIAGNOSIS — Z720 Tobacco use: Secondary | ICD-10-CM | POA: Insufficient documentation

## 2019-09-22 DIAGNOSIS — J301 Allergic rhinitis due to pollen: Secondary | ICD-10-CM

## 2019-09-22 DIAGNOSIS — F17210 Nicotine dependence, cigarettes, uncomplicated: Secondary | ICD-10-CM | POA: Diagnosis not present

## 2019-09-22 DIAGNOSIS — Z789 Other specified health status: Secondary | ICD-10-CM | POA: Diagnosis not present

## 2019-09-22 MED ORDER — FLUTICASONE PROPIONATE 50 MCG/ACT NA SUSP: 1.0000 | Freq: Every day | NASAL | 3 refills | Status: AC

## 2019-09-22 NOTE — Assessment & Plan Note (Signed)
Patient still reporting daily nasal congestion Clear nasal drainage  Plan: Continue daily allergy pill Start nasal saline rinses Start fluticasone nasal spray

## 2019-09-22 NOTE — Patient Instructions (Addendum)
You were seen today by Coral Ceo, NP  for:   1. Moderate persistent asthma without complication  Only use your albuterol as a rescue medication to be used if you can't catch your breath by resting or doing a relaxed purse lip breathing pattern.  - The less you use it, the better it will work when you need it. - Ok to use up to 2 puffs  every 4 hours if you must but call for immediate appointment if use goes up over your usual need - Don't leave home without it !!  (think of it like the spare tire for your car)   2. Seasonal allergic rhinitis due to pollen  - fluticasone (FLONASE) 50 MCG/ACT nasal spray; Place 1 spray into both nostrils daily.  Dispense: 16 g; Refill: 3  3. Tobacco use 4. Electronic cigarette use  I would recommend starting nicotine patches  Please contact primary care or dermatology and ensure there is no issues with you starting nicotine patches with you currently taking Accutane  We recommend that you stop smoking.  >>>You need to set a quit date >>>If you have friends or family who smoke, let them know you are trying to quit and not to smoke around you or in your living environment  Smoking Cessation Resources:  1 800 QUIT NOW  >>> Patient to call this resource and utilize it to help support her quit smoking >>> Keep up your hard work with stopping smoking  You can also contact the [facility] >>>For smoking cessation classes call [phone]  We do not recommend using e-cigarettes as a form of stopping smoking  You can sign up for smoking cessation support texts and information:  >>>https://smokefree.gov/smokefreetxt   Nicotine patches: >>>Make sure you rotate sites that you do not get skin irritation, Apply 1 patch each morning to a non-hairy skin site  If you are smoking greater than 10 cigarettes/day and weigh over 45 kg start with the nicotine patch of 21 mg a day for 6 weeks, then 14 mg a day for 2 weeks, then finished with 7 mg a  day for 2 weeks, then stop   >>>If insomnia occurs you are having trouble sleeping you can take the patch off at night, and place a new one on in the morning >>>If the patch is removed at night and you have morning cravings start short acting nicotine replacement therapy such as gum or lozenges     We recommend today:   Meds ordered this encounter  Medications  . fluticasone (FLONASE) 50 MCG/ACT nasal spray    Sig: Place 1 spray into both nostrils daily.    Dispense:  16 g    Refill:  3    Follow Up:    No follow-ups on file.   Please do your part to reduce the spread of COVID-19:      Reduce your risk of any infection  and COVID19 by using the similar precautions used for avoiding the common cold or flu:  Marland Kitchen Wash your hands often with soap and warm water for at least 20 seconds.  If soap and water are not readily available, use an alcohol-based hand sanitizer with at least 60% alcohol.  . If coughing or sneezing, cover your mouth and nose by coughing or sneezing into the elbow areas of your shirt or coat, into a tissue or into your sleeve (not your hands). Drinda Butts A MASK when in public  . Avoid shaking hands with  others and consider head nods or verbal greetings only. . Avoid touching your eyes, nose, or mouth with unwashed hands.  . Avoid close contact with people who are sick. . Avoid places or events with large numbers of people in one location, like concerts or sporting events. . If you have some symptoms but not all symptoms, continue to monitor at home and seek medical attention if your symptoms worsen. . If you are having a medical emergency, call 911.   ADDITIONAL HEALTHCARE OPTIONS FOR PATIENTS  [facility] / e-Visit: [email or web address]         [facility]: [phone]  [facility]: [phone]                   [facility]: [phone]     It is flu season:     >>> Best ways to protect herself from the flu: Receive the yearly flu vaccine, practice good hand hygiene washing with soap and also using hand sanitizer when available, eat a nutritious meals, get adequate rest, hydrate appropriately   Please contact the office if your symptoms worsen or you have concerns that you are not improving.   Thank you for choosing [facility] for your healthcare, and for allowing us to partner with you on your healthcare journey. I am thankful to be able to provide care to you today.   Elisha HeadlandBrian Burton Gahan FNP-C   Health Risks of Smoking Smoking cigarettes is very bad for your health. Tobacco smoke has over 200 known poisons in it. It contains the poisonous gases nitrogen oxide and carbon monoxide. There are over 60 chemicals in tobacco smoke that cause cancer. Smoking is difficult to quit because a chemical in tobacco, called nicotine, causes addiction or dependence. When you smoke and inhale, nicotine is absorbed rapidly into the bloodstream through your lungs. Both inhaled and non-inhaled nicotine may be addictive. What are the risks of cigarette smoke? Cigarette smokers have an increased risk of many serious medical problems, including:  Lung cancer.  Lung disease, such as pneumonia, bronchitis, and emphysema.  Chest pain (angina) and heart attack because the heart is not getting enough oxygen.  Heart disease and peripheral blood vessel disease.  High blood pressure (hypertension).  Stroke.  Oral cancer, including cancer of the lip, mouth, or voice box.  Bladder cancer.  Pancreatic cancer.  Cervical cancer.  Pregnancy complications, including premature birth.  Stillbirths and smaller newborn babies, birth defects, and genetic damage to sperm.  Early menopause.  Lower estrogen level for women.  Infertility.  Facial wrinkles.  Blindness.  Increased risk of broken bones (fractures).  Senile dementia.  Stomach ulcers and internal  bleeding.  Delayed wound healing and increased risk of complications during surgery.  Even smoking lightly shortens your life expectancy by several years. Because of secondhand smoke exposure, children of smokers have an increased risk of the following:  Sudden infant death syndrome (SIDS).  Respiratory infections.  Lung cancer.  Heart disease.  Ear infections. What are the benefits of quitting? There are many health benefits of quitting smoking. Here are some of them:  Within days of quitting smoking, your risk of having a heart attack decreases, your blood flow improves, and your lung capacity improves. Blood pressure, pulse rate, and breathing patterns start returning to normal soon after quitting.  Within months, your lungs may clear up completely.  Quitting for 10 years reduces your risk of developing lung cancer and heart disease to almost  that of a nonsmoker.  People who quit may see an improvement in their overall quality of life. How do I quit smoking?     Smoking is an addiction with both physical and psychological effects, and longtime habits can be hard to change. Your health care provider can recommend:  Programs and community resources, which may include group support, education, or talk therapy.  Prescription medicines to help reduce cravings.  Nicotine replacement products, such as patches, gum, and nasal sprays. Use these products only as directed. Do not replace cigarette smoking with electronic cigarettes, which are commonly called e-cigarettes. The safety of e-cigarettes is not known, and some may contain harmful chemicals.  A combination of two or more of these methods. Where to find more information  American Lung Association: www.lung.org  American Cancer Society: www.cancer.org Summary  Smoking cigarettes is very bad for your health. Cigarette smokers have an increased risk of many serious medical problems, including several cancers, heart  disease, and stroke.  Smoking is an addiction with both physical and psychological effects, and longtime habits can be hard to change.  By stopping right away, you can greatly reduce the risk of medical problems for you and your family.  To help you quit smoking, your health care provider can recommend programs, community resources, prescription medicines, and nicotine replacement products such as patches, gum, and nasal sprays. This information is not intended to replace advice given to you by your health care provider. Make sure you discuss any questions you have with your health care provider. Document Revised: 05/30/2017 Document Reviewed: 03/02/2016 Elsevier Patient Education  2020 Elsevier Inc.   Tobacco Use Disorder Tobacco use disorder (TUD) occurs when a person craves, seeks, and uses tobacco, regardless of the consequences. This disorder can cause problems with mental and physical health. It can affect your ability to have healthy relationships, and it can keep you from meeting your responsibilities at work, home, or school. Tobacco may be:  Smoked as a cigarette or cigar.  Inhaled using e-cigarettes.  Smoked in a pipe or hookah.  Chewed as smokeless tobacco.  Inhaled into the nostrils as snuff. Tobacco products contain a dangerous chemical called nicotine, which is very addictive. Nicotine triggers hormones that make the body feel stimulated and works on areas of the brain that make you feel good. These effects can make it hard for people to quit nicotine. Tobacco contains many other unsafe chemicals that can damage almost every organ in the body. Smoking tobacco also puts others in danger due to fire risk and possible health problems caused by breathing in secondhand smoke. What are the signs or symptoms? Symptoms of TUD may include:  Being unable to slow down or stop your tobacco use.  Spending an abnormal amount of time getting or using tobacco.  Craving tobacco.  Cravings may last for up to 6 months after quitting.  Tobacco use that: ? Interferes with your work, school, or home life. ? Interferes with your personal and social relationships. ? Makes you give up activities that you once enjoyed or found important.  Using tobacco even though you know that it is: ? Dangerous or bad for your health or someone else's health. ? Causing problems in your life.  Needing more and more of the substance to get the same effect (developing tolerance).  Experiencing unpleasant symptoms if you do not use the substance (withdrawal). Withdrawal symptoms may include: ? Depressed, anxious, or irritable mood. ? Difficulty concentrating. ? Increased appetite. ? Restlessness or trouble  sleeping.  Using the substance to avoid withdrawal. How is this diagnosed? This condition may be diagnosed based on:  Your current and past tobacco use. Your health care provider may ask questions about how your tobacco use affects your life.  A physical exam. You may be diagnosed with TUD if you have at least two symptoms within a 70-month period. How is this treated? This condition is treated by stopping tobacco use. Many people are unable to quit on their own and need help. Treatment may include:  Nicotine replacement therapy (NRT). NRT provides nicotine without the other harmful chemicals in tobacco. NRT gradually lowers the dosage of nicotine in the body and reduces withdrawal symptoms. NRT is available as: ? Over-the-counter gums, lozenges, and skin patches. ? Prescription mouth inhalers and nasal sprays.  Medicine that acts on the brain to reduce cravings and withdrawal symptoms.  A type of talk therapy that examines your triggers for tobacco use, how to avoid them, and how to cope with cravings (behavioral therapy).  Hypnosis. This may help with withdrawal symptoms.  Joining a support group for others coping with TUD. The best treatment for TUD is usually a combination  of medicine, talk therapy, and support groups. Recovery can be a long process. Many people start using tobacco again after stopping (relapse). If you relapse, it does not mean that treatment will not work. Follow these instructions at home:  Lifestyle  Do not use any products that contain nicotine or tobacco, such as cigarettes and e-cigarettes.  Avoid things that trigger tobacco use as much as you can. Triggers include people and situations that usually cause you to use tobacco.  Avoid drinks that contain caffeine, including coffee. These may worsen some withdrawal symptoms.  Find ways to manage stress. Wanting to smoke may cause stress, and stress can make you want to smoke. Relaxation techniques such as deep breathing, meditation, and yoga may help.  Attend support groups as needed. These groups are an important part of long-term recovery for many people. General instructions  Take over-the-counter and prescription medicines only as told by your health care provider.  Check with your health care provider before taking any new prescription or over-the-counter medicines.  Decide on a friend, family member, or smoking quit-line (such as 1-800-QUIT-NOW in the [place] that you can call or text when you feel the urge to smoke or when you need help coping with cravings.  Keep all follow-up visits as told by your health care provider and therapist. This is important. Contact a health care provider if:  You are not able to take your medicines as prescribed.  Your symptoms get worse, even with treatment. Summary  Tobacco use disorder (TUD) occurs when a person craves, seeks, and uses tobacco regardless of the consequences.  This condition may be diagnosed based on your current and past tobacco use and a physical exam.  Many people are unable to quit on their own and need help. Recovery can be a long process.  The most effective treatment for TUD is usually a combination of medicine, talk  therapy, and support groups. This information is not intended to replace advice given to you by your health care provider. Make sure you discuss any questions you have with your health care provider. Document Revised: 02/13/2017 Document Reviewed: 02/13/2017 Elsevier Patient Education  2020 [employer].   Electronic Cigarette Information  Electronic cigarettes, or e-cigarettes, are battery-operated devices that deliver nicotine--a very addictive drug--to the body. They come in many shapes, including  in the shape of a cigarette, pipe, pen, and even a USB memory stick. E-cigarettes have a cartridge that contains a liquid form of nicotine. When a person uses the device, the liquid heats up. It then becomes a vapor. Inhaling this vapor is called vaping. Nicotine is thought to increase your risk for certain types of cancer. In addition to nicotine, e-cigarettes may contain other harmful and cancer-causing chemicals, including:  Formaldehyde.  Acetaldehyde.  Heavy metals.  Ultra-fine particles that can get inhaled deep into the lungs.  Chemical colorings and flavorings. It is not clear how much nicotine you get when vaping, and it is hard to know what chemicals are in the vaping liquids. The health effects of vaping are not completely known, but you should be aware of the possible dangers of using these products. Some people may use e-cigarettes in order to quit smoking tobacco. However, this has not been proven to work, and the Education officer, environmental (FDA) has not approved e-cigarettes for this purpose. How can using electronic cigarettes affect me?  You may be at risk for developing a dangerous lung disease. There are reports of an increasing number of cases involving serious lung problems, and even death, associated with e-cigarette use. Your risk may be even higher if you: ? Buy e-cigarettes or vaping oils off the street. ? Add any substances to the e-cigarettes that are not intended  by the manufacturer.  Vaping may make you crave nicotine. Nicotine does the following: ? Changes your blood sugar levels. ? Increases your heart rate, blood pressure, and breathing rate. ? Increases your risk of developing blood clots (hypercoagulable state) and diabetes. ? Increases your risk of gum disease that may lead to losing teeth.  If you smoke e-cigarettes, you may be more likely to start smoking or to smoke more tobacco cigarettes.  Becoming addicted to nicotine may make your brain more sensitive to other addictive drugs. You may move to other addictive substances.  You may be in danger of overdosing on nicotine. Nicotine poisoning can cause nausea, vomiting, seizures, and trouble breathing.  An e-cigarette may explode and cause fires and burn injuries.  If you are pregnant, the nicotine in e-cigarettes may be harmful to your baby. Nicotine can cause: ? Brain or lung problems for your baby. ? Your baby to be born too early. ? Your baby to be born with a low birth weight.  Vaping has also been linked to decreases in memory and attention span in children and teens. What actions can I take to stop vaping? If you can, stop vaping on your own before you become addicted to nicotine. If you need help quitting, ask your health care provider. There are three effective ways to fight nicotine addiction:  Nicotine replacement therapy. Using nicotine gum or a nicotine patch blocks your craving for nicotine. Over time, you can reduce the amount of nicotine you use until you can stop using nicotine completely without having cravings.  Prescription medicines approved to fight nicotine addiction. These stop nicotine cravings or block the effects of nicotine.  Behavioral therapy. This may include: ? A self-help smoking cessation program. ? Individual or group therapy. ? A smoking cessation support group. There are several national programs to help you quit smoking or vaping. These  include:  Text message programs, such as SmokefreeTXT.  Apps for mobile phones, including the free quitSTART app.  Hotlines, such as 1-800-QUIT-NOW 925-615-0840). Where to find support You can get support at these sites:  [place]  Department of Health and Human Services: https://smokefree.gov  American Lung Association: www.lung.org Where to find more information Learn more about e-cigarettes from:  [employer] on Drug Abuse: http://www.price-smith.com/  Centers for Disease Control and Prevention: FootballExhibition.com.br Summary  E-cigarettes can cause nicotine addiction.  E-cigarettes are not approved as a way to stop smoking. They are not a risk-free alternative to smoking tobacco.  There are reports of an increasing number of cases involving serious lung problems, and even death, associated with e-cigarette use.  If you can stop vaping on your own, do it before you become addicted to nicotine. If you need help quitting, ask your health care provider.  There are various methods and programs that can help you stop smoking or vaping. This information is not intended to replace advice given to you by your health care provider. Make sure you discuss any questions you have with your health care provider. Document Revised: 06/24/2018 Document Reviewed: 05/09/2018 Elsevier Patient Education  2020 [employer].

## 2019-09-22 NOTE — Assessment & Plan Note (Signed)
Plan: We recommend the patient stop vaping or using any sort of tobacco products Once patient has heard back from primary care or dermatology regarding nicotine patches and Accutane we can start her on a 21 mg nicotine patch to help her with quitting  We discussed today that continuing to use tobacco products or vaping increases her risk of developing lung disease in the future.

## 2019-09-22 NOTE — Assessment & Plan Note (Signed)
Current vape user Using 150 to 300 puffs/day 1 cartridge is lasting her about 10 days per patient She is willing to stop vaping  Plan: Patient to contact primary care or dermatology to ensure there is no issues with her starting nicotine patches with her being on Accutane  She will then contact our office back.  Then we can start her on a 21 mg nicotine patch so she can stop vaping  Follow-up with our office in 6 months

## 2019-09-22 NOTE — Progress Notes (Signed)
Reviewed and agree with assessment/plan.   Coralyn Helling, MD [facility] 09/22/2019, 3:05 PM Pager:  [phone]

## 2019-09-22 NOTE — Assessment & Plan Note (Signed)
Reviewed pulmonary function testing today Patient has not needed to use her rescue inhaler at all Still takes daily and histamine Has some nasal congestion  Plan: Continue rescue inhaler as needed Follow-up with our office in 6 months Stop vaping/using tobacco products

## 2019-09-22 NOTE — Progress Notes (Signed)
Virtual Visit via Telephone Note  I connected with Linda Wallace on 09/22/19 at  2:30 PM EDT by telephone and verified that I am speaking with the correct person using two identifiers.  Location: Patient: Home Provider: Office [facility] - [name], [address], [place], [place] [address]   I discussed the limitations, risks, security and privacy concerns of performing an evaluation and management service by telephone and the availability of in person appointments. I also discussed with the patient that there may be a patient responsible charge related to this service. The patient expressed understanding and agreed to proceed.  Patient consented to consult via telephone: Yes People present and their role in pt care: Pt     History of Present Illness:  29 year old female current vape user followed in our office for asthma  Past medical history:allergic rhinitis Smoking history: Former smoker.  Quit 2017. Current vaper.  Maintenance: none Patient of Dr. Craige Cotta  Chief complaint:    29 year old female former smoker followed in our office for asthma.  She is followed by Dr. Craige Cotta.  Last office visit was in May/2021.  At that time it was recommended that she continue on a daily antihistamine.  She was encouraged to obtain pulmonary function testing.  She is completing a televisit today to review those results.  It was also emphasized the importance that she stop vaping.  08/31/2019-pulmonary function test-FVC 3.82 (100% predicted), postbronchodilator ratio 91, postbronchodilator FEV1 3.52 (108% predicted), no bronchodilator response, DLCO 25.17 (112% predicted) Spirometry normal, no bronchodilator response, normal diffusion capacity, normal lung volumes  Patient reporting today that she has not needed her rescue inhaler at all.  She does still continue to have clear nasal drainage.  As well as nasal congestion.  We will discuss this today.  She reports that this may be due to the  fact that she is on Accutane and she has dry nasal passages.  She is still vaping.  See smoking assessment and cessation counseling below:   Smoking assessment and cessation counseling  Patient currently smoking: still vaping, patient reporting 150 to 300 puffs/day, she reports that a vape cartridges currently lasting her about 10 days I have advised the patient to quit/stop smoking as soon as possible due to high risk for multiple medical problems.  It will also be very difficult for [place] to manage patient's  respiratory symptoms and status if we continue to expose her lungs to a known irritant.  We do not advise e-cigarettes as a form of stopping smoking.  Patient is willing to quit smoking/vaping.  She is struggled with the first 2 to 3 days with quitting cold [place].  She is willing to consider nicotine replacement patches.  She will follow up with primary care or dermatology to ensure there is no issues with her starting this with her being on Accutane.  She will then contact our office.  She is aware she needs to set a quit date.  We discussed today that continuing to vape/using tobacco products further increases her chances of having lung disease.  I have advised the patient that we can assist and have options of nicotine replacement therapy, provided smoking cessation education today, provided smoking cessation counseling, and provided cessation resources.  Follow-up next office visit office visit for assessment of smoking cessation.    Smoking cessation counseling advised for: 4 min     Observations/Objective:  Social History   Tobacco Use  Smoking Status Former Smoker  . Packs/day: 0.50  .  Types: Cigarettes, E-cigarettes  Smokeless Tobacco Never Used   Immunization History  Administered Date(s) Administered  . Tdap 06/04/2012      Assessment and Plan:  Electronic cigarette use Current vape user Using 150 to 300 puffs/day 1 cartridge is lasting her about 10 days per  patient She is willing to stop vaping  Plan: Patient to contact primary care or dermatology to ensure there is no issues with her starting nicotine patches with her being on Accutane  She will then contact our office back.  Then we can start her on a 21 mg nicotine patch so she can stop vaping  Follow-up with our office in 6 months  Tobacco use Plan: We recommend the patient stop vaping or using any sort of tobacco products Once patient has heard back from primary care or dermatology regarding nicotine patches and Accutane we can start her on a 21 mg nicotine patch to help her with quitting  We discussed today that continuing to use tobacco products or vaping increases her risk of developing lung disease in the future.  Moderate persistent asthma without complication Reviewed pulmonary function testing today Patient has not needed to use her rescue inhaler at all Still takes daily and histamine Has some nasal congestion  Plan: Continue rescue inhaler as needed Follow-up with our office in 6 months Stop vaping/using tobacco products  Seasonal allergic rhinitis due to pollen Patient still reporting daily nasal congestion Clear nasal drainage  Plan: Continue daily allergy pill Start nasal saline rinses Start fluticasone nasal spray   Follow Up Instructions:  Return in about 6 months (around 03/24/2020), or if symptoms worsen or fail to improve, for Follow up with Dr. Craige Cotta.   I discussed the assessment and treatment plan with the patient. The patient was provided an opportunity to ask questions and all were answered. The patient agreed with the plan and demonstrated an understanding of the instructions.   The patient was advised to call back or seek an in-person evaluation if the symptoms worsen or if the condition fails to improve as anticipated.  I provided 25 minutes of non-face-to-face time during this encounter.   Coral Ceo, NP

## 2020-08-11 ENCOUNTER — Ambulatory Visit: Payer: BLUE CROSS/BLUE SHIELD | Admitting: Family Medicine

## 2020-08-11 DIAGNOSIS — Z0289 Encounter for other administrative examinations: Secondary | ICD-10-CM

## 2021-01-16 IMAGING — CR CHEST - 2 VIEW
2 series · 2 of 2 positions shown · non-contrast
Comparison: Radiograph 07/18/2017

CLINICAL DATA: Chest pain, short of breath.

EXAM:
CHEST - 2 VIEW

[w chest pa]
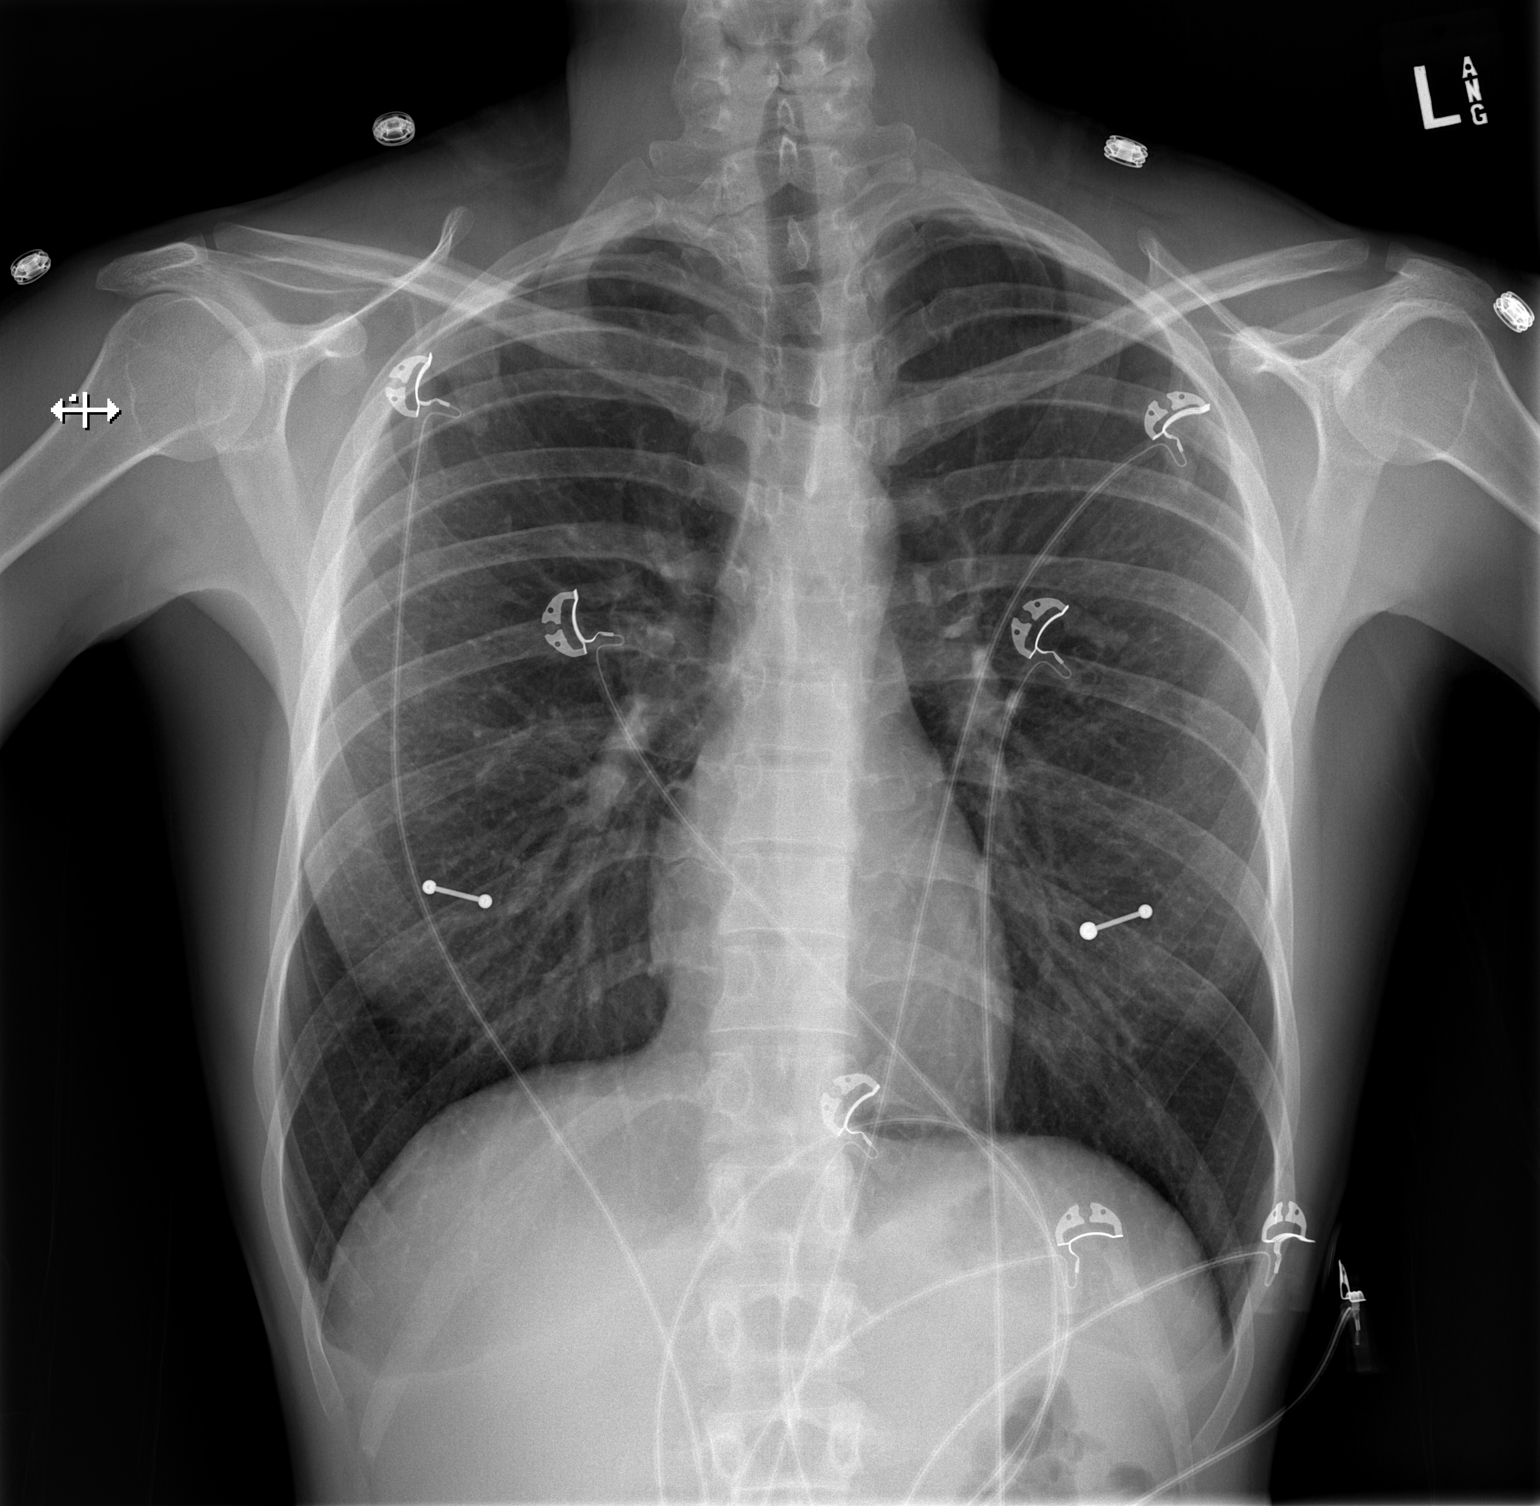

[w chest lat]
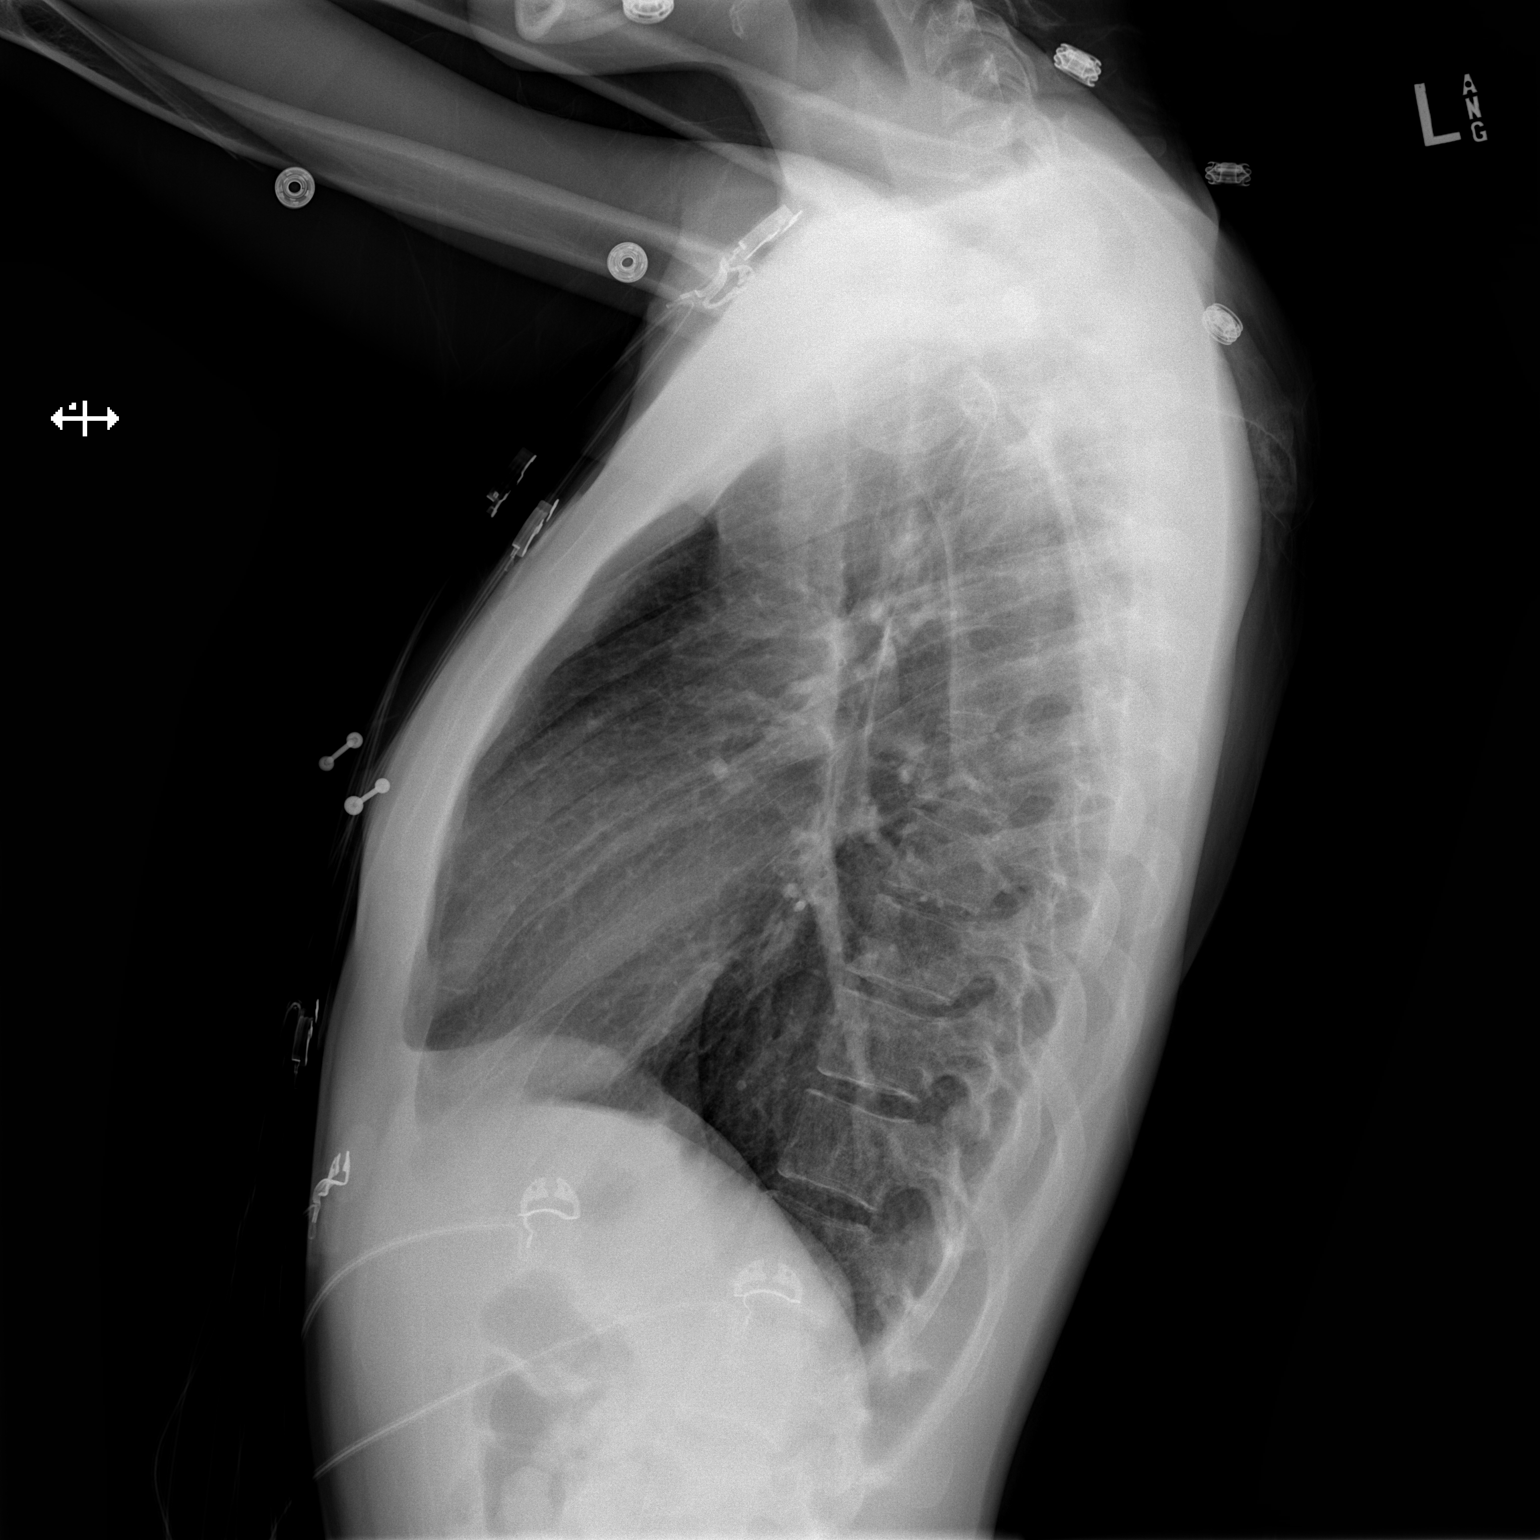

[2 of 2 positions shown; findings below may reference images not displayed]

FINDINGS: Normal mediastinum and cardiac silhouette. Normal pulmonary
vasculature. No evidence of effusion, infiltrate, or pneumothorax.
No acute bony abnormality.
IMPRESSION: Normal chest radiograph.

## 2021-02-16 IMAGING — DX PORTABLE CHEST - 1 VIEW
1 series · 1 of 1 positions shown · non-contrast
Comparison: PA and lateral chest 07/19/2018 and 07/18/2017.

CLINICAL DATA: Cough, shortness of breath, chills and body aches
for 3 days.

EXAM:
PORTABLE CHEST 1 VIEW

[chest ap]
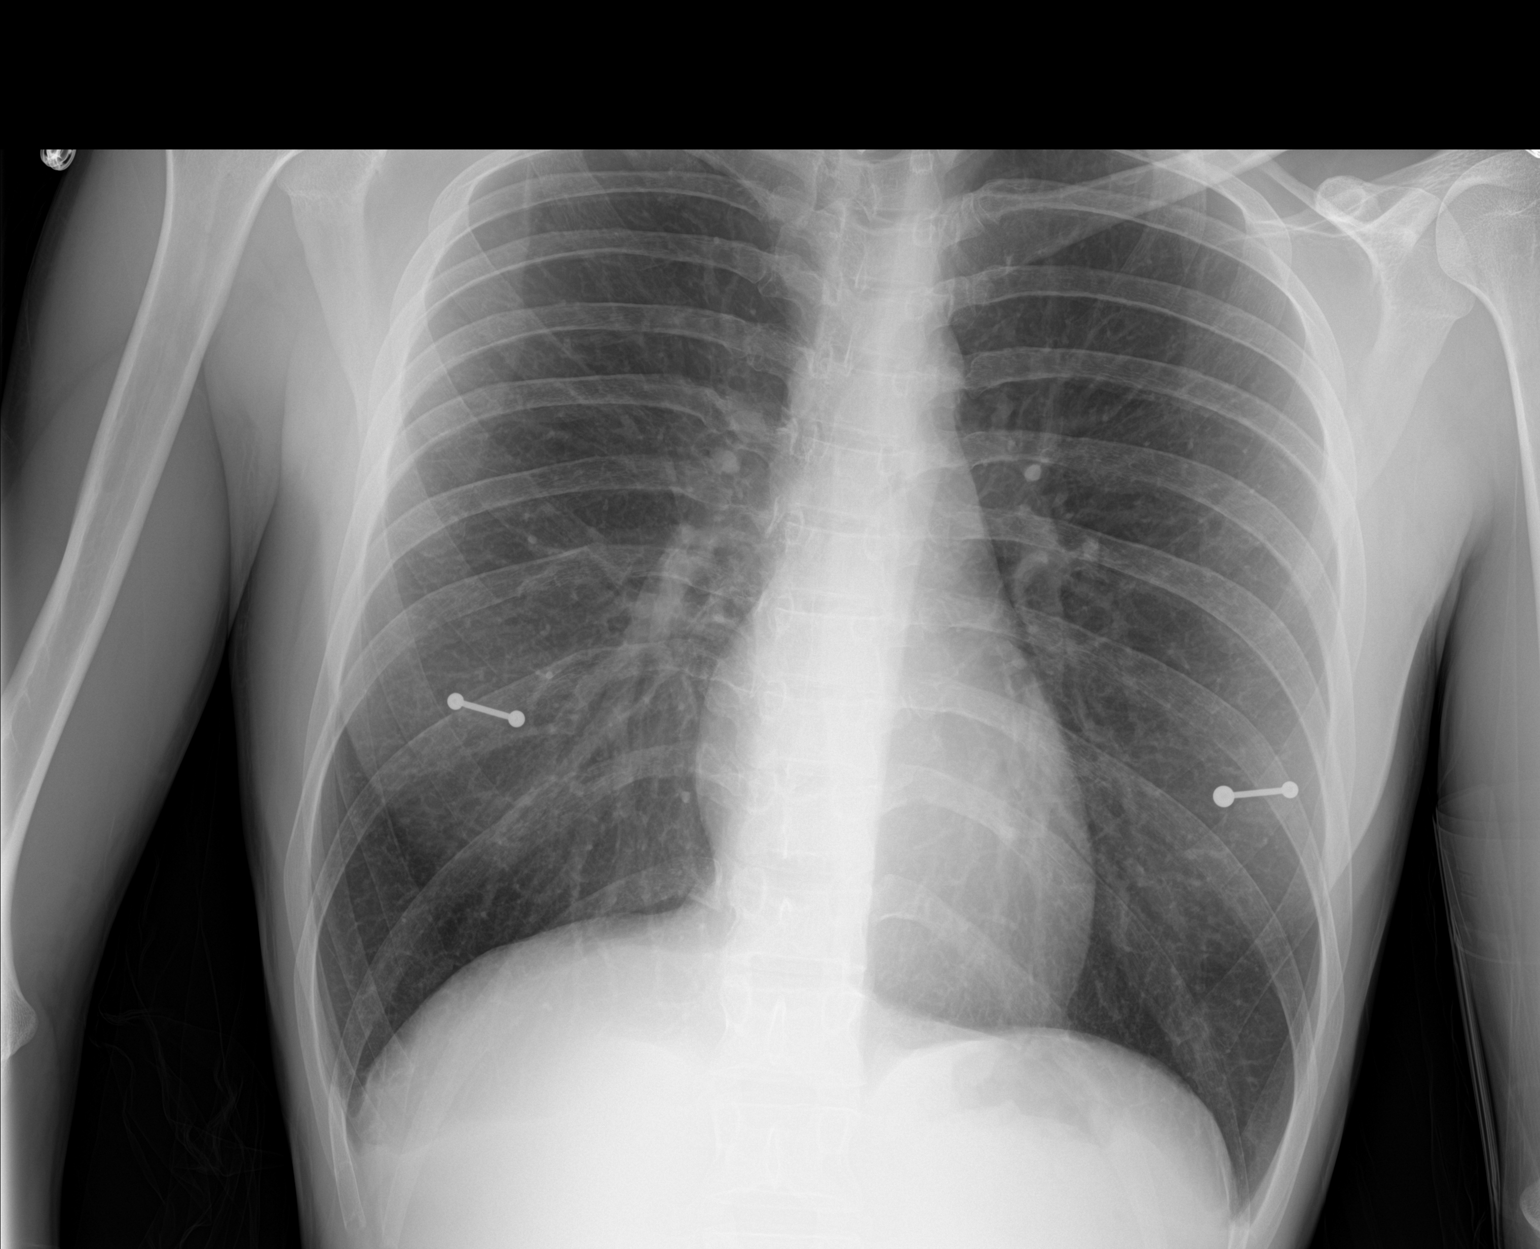

[1 of 1 positions shown; findings below may reference images not displayed]

FINDINGS: The lungs are clear. Heart size is normal. No pneumothorax or
pleural effusion. No acute or focal bony abnormality.
IMPRESSION: Normal chest.

## 2021-04-06 IMAGING — CT CT ABDOMEN AND PELVIS WITH CONTRAST
1 of 2 series · 13 of 32 positions shown, 18 images · IV contrast (iopamidol)
Comparison: January 26, 2017

CLINICAL DATA: Abdominal pain. Periumbilical soft tissue prominence

EXAM:
CT ABDOMEN AND PELVIS WITH CONTRAST
TECHNIQUE: Multidetector CT imaging of the abdomen and pelvis was performed
using the standard protocol following bolus administration of
intravenous contrast. Oral contrast was also administered.
CONTRAST:  100mL [id number] IOPAMIDOL ([id number]) INJECTION 61%

[Series 2: abd/pelvis w/cm · axial · 0.60mm/px · z∈[-447,-82]mm · 13 of 83 slices shown, 18 images]
[im 5/83  soft-tissue]
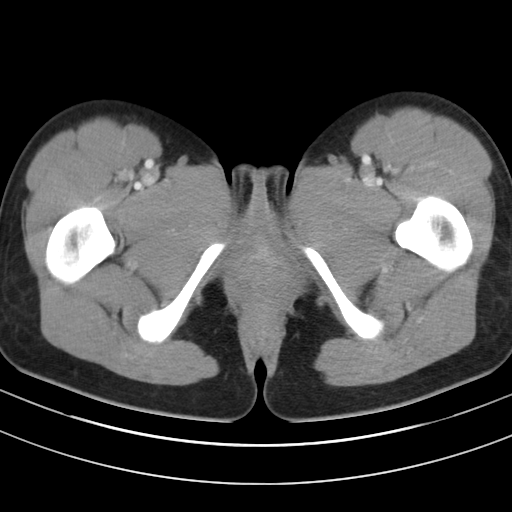
[im 5/83  bone]
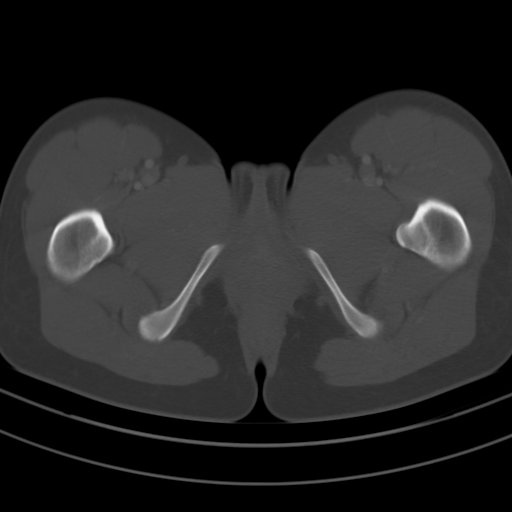
[im 13/83  soft-tissue]
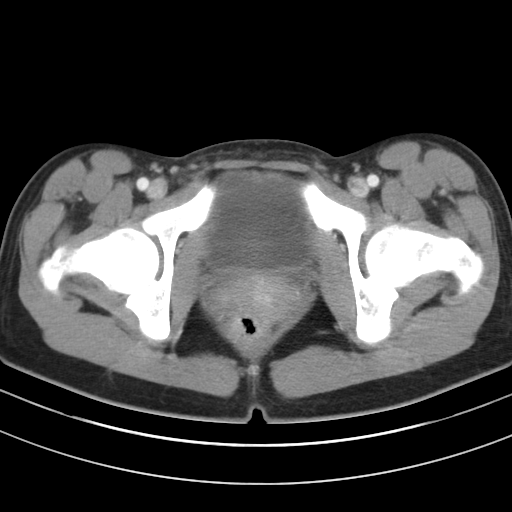
[im 18/83  soft-tissue]
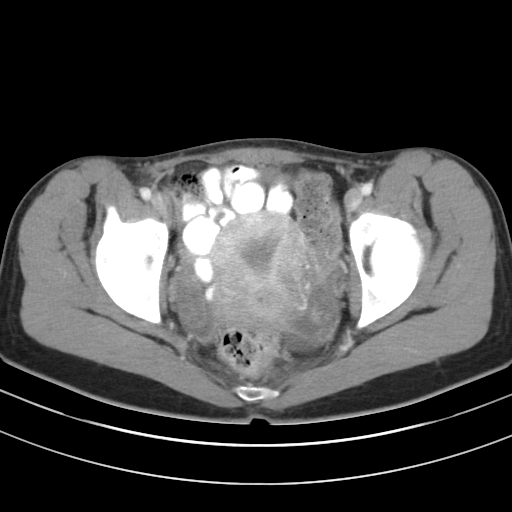
[im 26/83  soft-tissue]
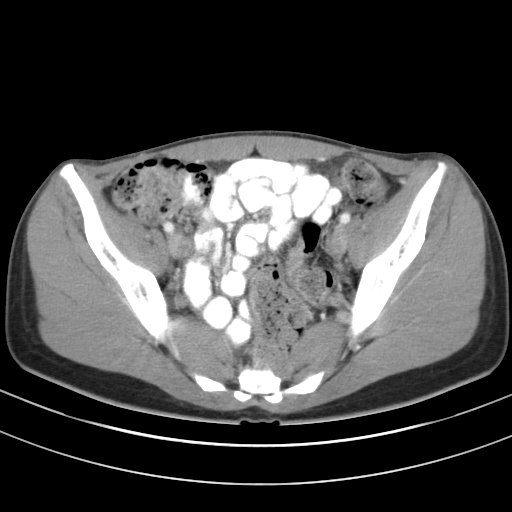
[im 31/83  soft-tissue]
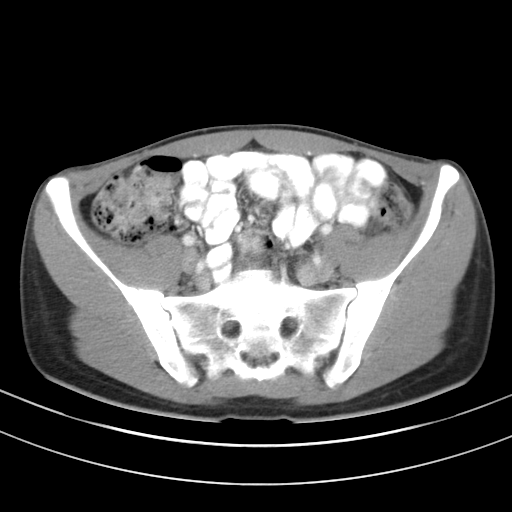
[im 39/83  soft-tissue]
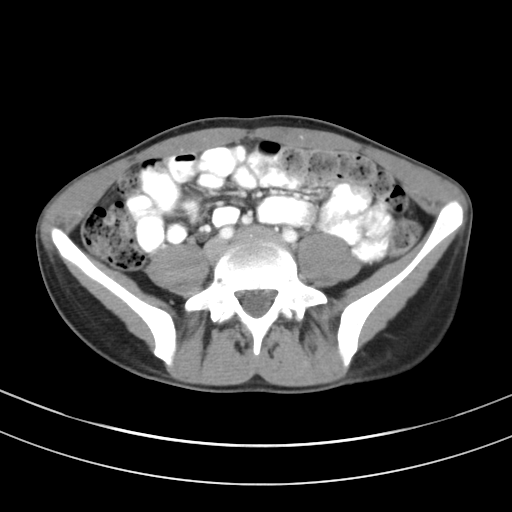
[im 44/83  soft-tissue]
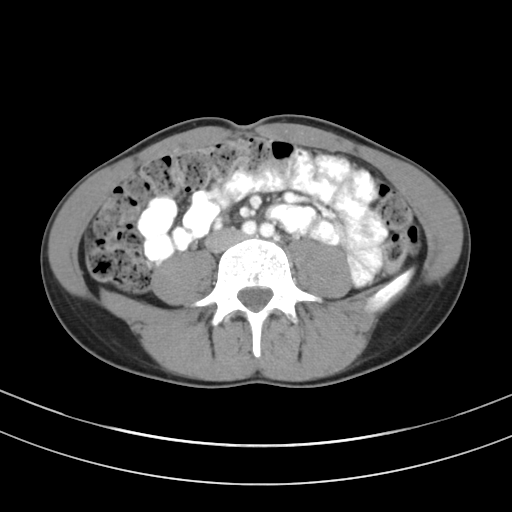
[im 52/83  soft-tissue]
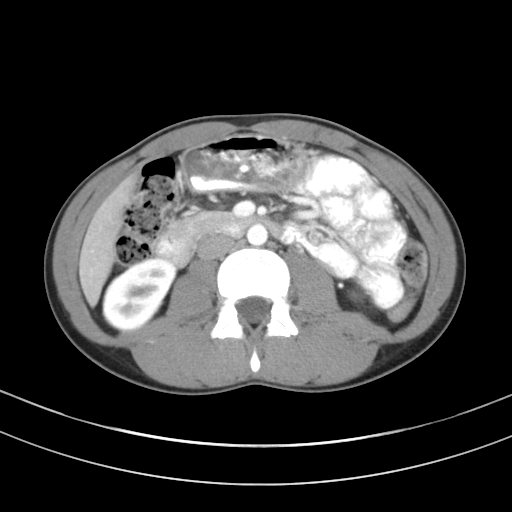
[im 57/83  soft-tissue]
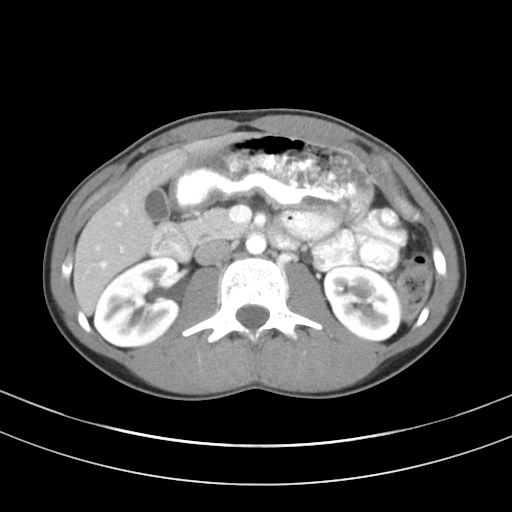
[im 57/83  bone]
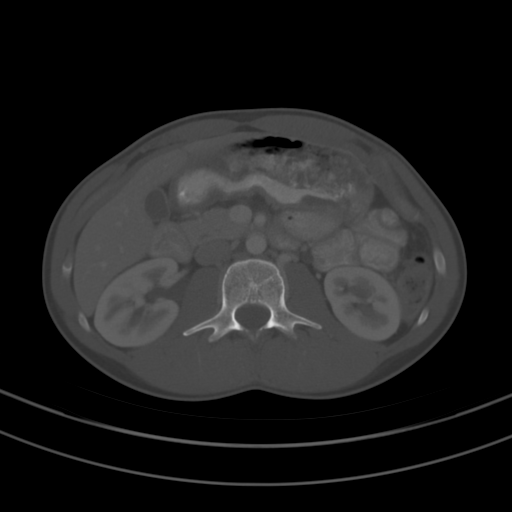
[im 65/83  soft-tissue]
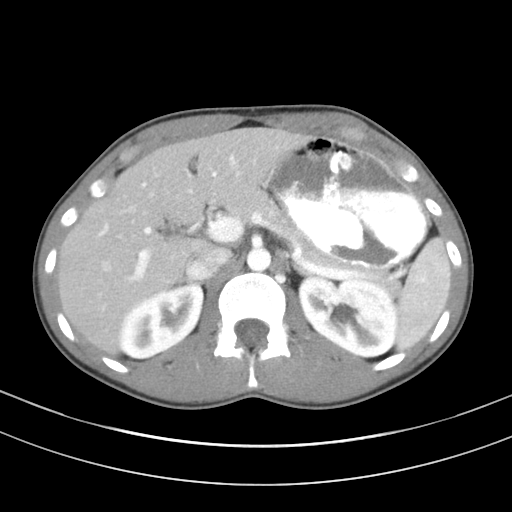
[im 65/83  lung]
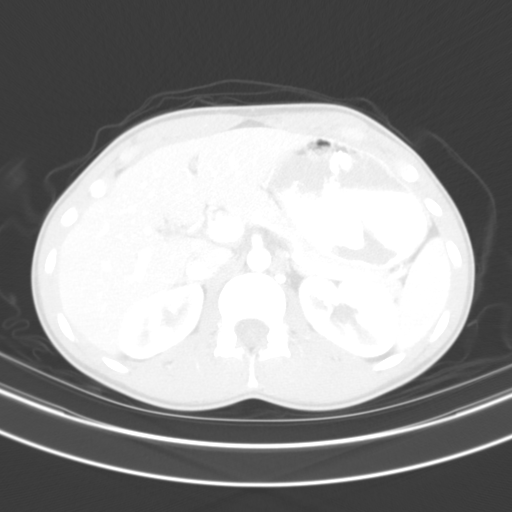
[im 70/83  soft-tissue]
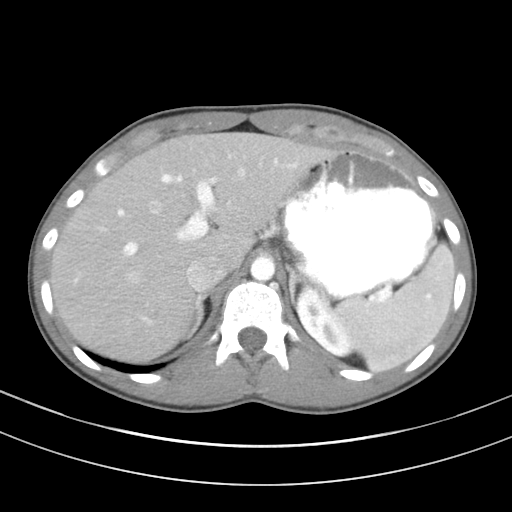
[im 70/83  lung]
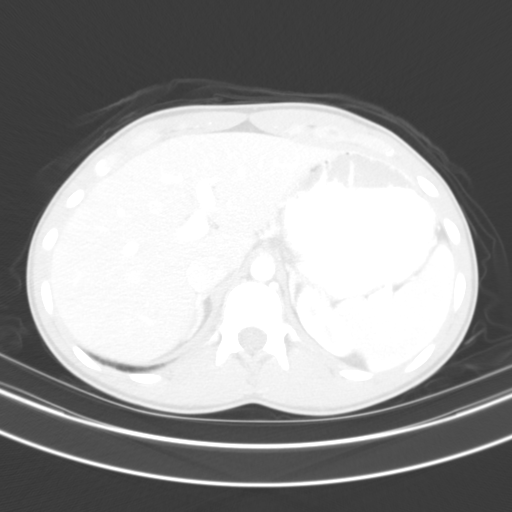
[im 74/83  lung]
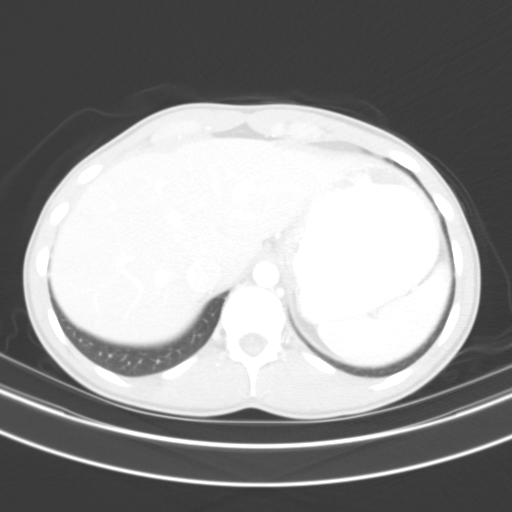
[im 78/83  soft-tissue]
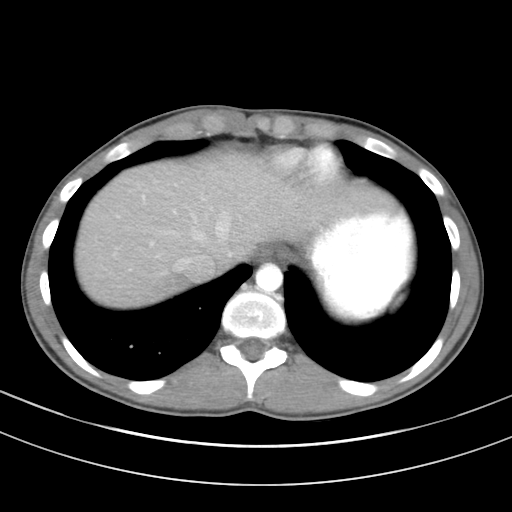
[im 78/83  lung]
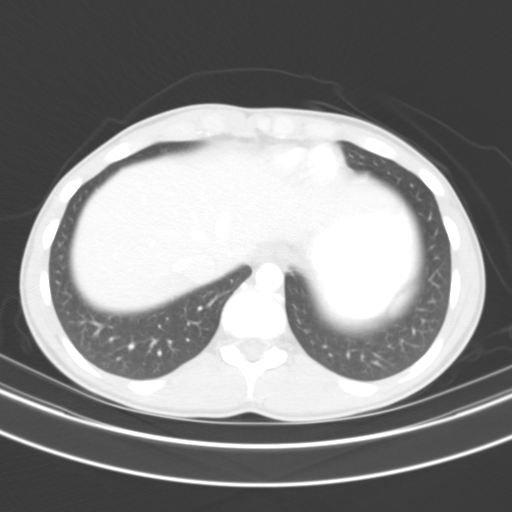

[13 of 32 positions shown; findings below may reference images not displayed]

FINDINGS: Lower chest: Lung bases are clear.

Hepatobiliary: No focal liver lesions are appreciable on this
noncontrast enhanced study. The gallbladder wall is not appreciably
thickened. There is no evident biliary duct dilatation.

Pancreas: There is no pancreatic mass or inflammatory focus.

Spleen: No splenic lesions are evident.

Adrenals/Urinary Tract: Adrenals bilaterally appear normal. Kidneys
bilaterally show no evident mass or hydronephrosis on either side.
There is no demonstrable renal or ureteral calculus on either side.
Urinary bladder is midline with wall thickness within normal limits.

Stomach/Bowel: There is diffuse stool throughout the colon. There is
fluid and food material throughout the stomach. There is no
appreciable bowel wall or mesenteric thickening. There is no evident
bowel obstruction. The terminal ileum appears unremarkable. No free
air or portal venous air evident.

Vascular/Lymphatic: There is no abdominal aortic aneurysm. No
vascular lesions are evident. There is no appreciable adenopathy in
the abdomen or pelvis.

Reproductive: Uterus is anteverted. There is a collapsed left
ovarian cyst measuring just under 2 x 2 cm. There is a small amount
of free fluid in the cul-de-sac.

Other: The appendix appears normal. No evident abscess or ascites in
the abdomen or pelvis. There is no demonstrable abdominal wall
hernia. No soft tissue mass noted in the periumbilical region by CT.

Musculoskeletal: There are no blastic or lytic bone lesions. There
is no intramuscular lesion.
IMPRESSION: 1. Small amount of fluid in the cul-de-sac region. Apparent recent
ovarian cyst rupture on the left as noted above.

2. Diffuse stool throughout colon. Stomach filled with fluid and
food material. No bowel obstruction. No abscess in the abdomen or
pelvis. Appendix appears normal.

3.  No evident hernia or abdominal wall mass.

4. No evident renal or ureteral calculus. No hydronephrosis. Urinary
bladder wall thickness normal.

## 2021-07-23 ENCOUNTER — Emergency Department (HOSPITAL_BASED_OUTPATIENT_CLINIC_OR_DEPARTMENT_OTHER): Payer: Self-pay | Admitting: Radiology

## 2021-07-23 ENCOUNTER — Encounter (HOSPITAL_BASED_OUTPATIENT_CLINIC_OR_DEPARTMENT_OTHER): Payer: Self-pay | Admitting: Obstetrics and Gynecology

## 2021-07-23 ENCOUNTER — Other Ambulatory Visit: Payer: Self-pay

## 2021-07-23 ENCOUNTER — Emergency Department (HOSPITAL_BASED_OUTPATIENT_CLINIC_OR_DEPARTMENT_OTHER)
Admission: EM | Admit: 2021-07-23 | Discharge: 2021-07-23 | Disposition: A | Discharge status: Home / Self Care | Payer: Self-pay | Attending: Emergency Medicine | Admitting: Emergency Medicine

## 2021-07-23 DIAGNOSIS — T71193A Asphyxiation due to mechanical threat to breathing due to other causes, assault, initial encounter: Secondary | ICD-10-CM | POA: Insufficient documentation

## 2021-07-23 DIAGNOSIS — S29001A Unspecified injury of muscle and tendon of front wall of thorax, initial encounter: Secondary | ICD-10-CM | POA: Insufficient documentation

## 2021-07-23 DIAGNOSIS — S298XXA Other specified injuries of thorax, initial encounter: Secondary | ICD-10-CM

## 2021-07-23 HISTORY — DX: Unspecified adult maltreatment, confirmed, initial encounter: T74.91XA

## 2021-07-23 LAB — COMPREHENSIVE METABOLIC PANEL
ALT: 15 U/L (ref 0–44)
AST: 18 U/L (ref 15–41)
Albumin: 4.4 g/dL (ref 3.5–5.0)
Alkaline Phosphatase: 43 U/L (ref 38–126)
Anion gap: 7 (ref 5–15)
BUN: 10 mg/dL (ref 6–20)
CO2: 27 mmol/L (ref 22–32)
Calcium: 9.2 mg/dL (ref 8.9–10.3)
Chloride: 104 mmol/L (ref 98–111)
Creatinine, Ser: 0.71 mg/dL (ref 0.44–1.00)
GFR, Estimated: >60 mL/min (ref >60)
Glucose, Bld: 92 mg/dL (ref 70–99)
Potassium: 3.7 mmol/L (ref 3.5–5.1)
Sodium: 138 mmol/L (ref 135–145)
Total Bilirubin: 0.6 mg/dL (ref 0.3–1.2)
Total Protein: 7 g/dL (ref 6.5–8.1)

## 2021-07-23 LAB — CBC
HCT: 41 % (ref 36.0–46.0)
Hemoglobin: 13.5 g/dL (ref 12.0–15.0)
MCH: 30 pg (ref 26.0–34.0)
MCHC: 32.9 g/dL (ref 30.0–36.0)
MCV: 91.1 fL (ref 80.0–100.0)
Platelets: 281 10*3/uL (ref 150–400)
RBC: 4.5 MIL/uL (ref 3.87–5.11)
RDW: 12.7 % (ref 11.5–15.5)
WBC: 5.6 10*3/uL (ref 4.0–10.5)
nRBC: 0 % (ref 0.0–0.2)

## 2021-07-23 LAB — HCG, SERUM, QUALITATIVE: Preg, Serum: NEGATIVE

## 2021-07-23 MED ORDER — IBUPROFEN 800 MG PO TABS: 800.0000 mg | ORAL_TABLET | Freq: Three times a day (TID) | ORAL | 0 refills | Status: AC

## 2021-07-23 MED ORDER — ONDANSETRON HCL 4 MG/2ML IJ SOLN
4.0000 mg | Freq: Once | INTRAMUSCULAR | Status: AC | PRN
  Administered 2021-07-23: 4 mg via INTRAVENOUS
  Filled 2021-07-23: qty 2

## 2021-07-23 MED ORDER — IBUPROFEN 800 MG PO TABS
800.0000 mg | ORAL_TABLET | Freq: Once | ORAL | Status: AC
  Administered 2021-07-23: 800 mg via ORAL
  Filled 2021-07-23: qty 1
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
## 2022-04-07 DIAGNOSIS — K0889 Other specified disorders of teeth and supporting structures: Secondary | ICD-10-CM | POA: Diagnosis not present

## 2022-04-28 DIAGNOSIS — R200 Anesthesia of skin: Secondary | ICD-10-CM | POA: Diagnosis not present

## 2022-04-28 DIAGNOSIS — R110 Nausea: Secondary | ICD-10-CM | POA: Diagnosis not present

## 2022-04-28 DIAGNOSIS — H53149 Visual discomfort, unspecified: Secondary | ICD-10-CM | POA: Diagnosis not present

## 2022-04-28 DIAGNOSIS — S0990XA Unspecified injury of head, initial encounter: Secondary | ICD-10-CM | POA: Diagnosis not present

## 2022-04-28 DIAGNOSIS — F0781 Postconcussional syndrome: Secondary | ICD-10-CM | POA: Diagnosis not present

## 2022-04-28 DIAGNOSIS — K089 Disorder of teeth and supporting structures, unspecified: Secondary | ICD-10-CM | POA: Diagnosis not present

## 2022-04-28 DIAGNOSIS — R519 Headache, unspecified: Secondary | ICD-10-CM | POA: Diagnosis not present

## 2022-06-11 DIAGNOSIS — R079 Chest pain, unspecified: Secondary | ICD-10-CM | POA: Diagnosis not present

## 2022-06-11 DIAGNOSIS — R112 Nausea with vomiting, unspecified: Secondary | ICD-10-CM | POA: Diagnosis not present

## 2022-06-11 DIAGNOSIS — M542 Cervicalgia: Secondary | ICD-10-CM | POA: Diagnosis not present

## 2022-06-11 DIAGNOSIS — T7421XA Adult sexual abuse, confirmed, initial encounter: Secondary | ICD-10-CM | POA: Diagnosis not present

## 2022-06-11 DIAGNOSIS — R0789 Other chest pain: Secondary | ICD-10-CM | POA: Diagnosis not present
